# Patient Record
Sex: Male | Born: 1965 | Race: White | Hispanic: No | Marital: Married | Smoking: Former smoker
Health system: Southern US, Community
[De-identification: ages and names within clinical notes are randomized; demographics above are authoritative.]

## PROBLEM LIST (undated history)

## (undated) DIAGNOSIS — I10 Essential (primary) hypertension: Secondary | ICD-10-CM

## (undated) HISTORY — PX: HERNIA REPAIR: SHX51

## (undated) HISTORY — PX: COLONOSCOPY: SHX174

## (undated) HISTORY — PX: KNEE SURGERY: SHX244

---

## 2007-04-17 ENCOUNTER — Emergency Department (HOSPITAL_COMMUNITY): Admission: EM | Admit: 2007-04-17 | Discharge: 2007-04-17 | Payer: Self-pay | Admitting: Emergency Medicine

## 2010-10-25 LAB — URINALYSIS, ROUTINE W REFLEX MICROSCOPIC
Bilirubin Urine: NEGATIVE
Ketones, ur: NEGATIVE
Nitrite: NEGATIVE
Protein, ur: NEGATIVE
Specific Gravity, Urine: 1.024
Urobilinogen, UA: 0.2

## 2010-10-25 LAB — BASIC METABOLIC PANEL
BUN: 12
CO2: 24
Chloride: 103
Glucose, Bld: 156 — ABNORMAL HIGH
Potassium: 4.6

## 2010-10-25 LAB — URINE MICROSCOPIC-ADD ON

## 2011-05-21 ENCOUNTER — Encounter (HOSPITAL_COMMUNITY): Payer: Self-pay

## 2011-05-21 ENCOUNTER — Emergency Department (INDEPENDENT_AMBULATORY_CARE_PROVIDER_SITE_OTHER)
Admission: EM | Admit: 2011-05-21 | Discharge: 2011-05-21 | Disposition: A | Discharge status: Home Health | Payer: BC Managed Care – PPO | Source: Home / Self Care

## 2011-05-21 DIAGNOSIS — S63509A Unspecified sprain of unspecified wrist, initial encounter: Secondary | ICD-10-CM

## 2011-05-21 DIAGNOSIS — S66911A Strain of unspecified muscle, fascia and tendon at wrist and hand level, right hand, initial encounter: Secondary | ICD-10-CM

## 2011-05-21 HISTORY — DX: Essential (primary) hypertension: I10

## 2011-05-21 MED ORDER — IBUPROFEN 800 MG PO TABS: 800.0000 mg | ORAL_TABLET | Freq: Three times a day (TID) | ORAL | Status: AC

## 2011-05-21 NOTE — ED Provider Notes (Signed)
History     CSN: [id number]  Arrival date & time 05/21/11  1191   None     Chief Complaint  Patient presents with  . Wrist Pain    (Consider location/radiation/quality/duration/timing/severity/associated sxs/prior treatment) HPI Comments: Patient presents today with complaints of right wrist injury. He states yesterday while at work he had leaned into his truck, leaning onto both hands. When he pushed off to stand back up he had pain in his right wrist. No pop. He continues to have intermittent pain in the lateral wrist with radial side bending of the hand. Pain is not reproduced with any other range of motion. He did take 1 ibuprofen 800 mg tablet last night which did provide moderate relief.   Past Medical History  Diagnosis Date  . Hypertension   . Diabetes mellitus     Past Surgical History  Procedure Date  . Hernia repair   . Knee surgery     History reviewed. No pertinent family history.  History  Substance Use Topics  . Smoking status: Former Games developer  . Smokeless tobacco: Not on file  . Alcohol Use: Yes      Review of Systems  Musculoskeletal: Negative for joint swelling.  Skin: Negative for color change and wound.  Neurological: Negative for numbness.    Allergies  Review of patient's allergies indicates no known allergies.  Home Medications   Current Outpatient Rx  Name Route Sig Dispense Refill  . ASPIRIN 81 MG PO TABS Oral Take 81 mg by mouth daily.    Marland Kitchen GLIPIZIDE 10 MG PO TABS Oral Take 10 mg by mouth 2 (two) times daily before a meal.    . LISINOPRIL 10 MG PO TABS Oral Take 10 mg by mouth daily.    Marland Kitchen METFORMIN HCL 1000 MG PO TABS Oral Take 1,000 mg by mouth 2 (two) times daily with a meal.    . IBUPROFEN 800 MG PO TABS Oral Take 1 tablet (800 mg total) by mouth 3 (three) times daily. 15 tablet 0    BP 136/87  Pulse 68  Temp(Src) 98.8 F (37.1 C) (Oral)  Resp 16  SpO2 98%  Physical Exam  Nursing note and vitals reviewed. Constitutional:  He appears well-developed and well-nourished. No distress.  HENT:  Head: Normocephalic and atraumatic.  Cardiovascular: Normal rate, regular rhythm and normal heart sounds.   Pulmonary/Chest: Effort normal and breath sounds normal. No respiratory distress.  Musculoskeletal:       Right wrist: He exhibits normal range of motion, no tenderness, no bony tenderness, no swelling, no effusion, no crepitus and no deformity.       Rt wrist medial pain reproduced with passive and active radial deviation of hand. Nontender to palpation.   Skin: Skin is warm and dry.  Psychiatric: He has a normal mood and affect.    ED Course  Procedures (including critical care time)  Labs Reviewed - No data to display No results found.   1. Strain of wrist, right       MDM          Melody Comas, PA 05/21/11 [phone]

## 2011-05-21 NOTE — ED Notes (Signed)
Pt c/o R wrist pain onset yesterday while bearing upper body weight on it in vehicle.  Pt took Ibuprofen 800mg  last night with some relief.  Pain increases with lateral movement.

## 2011-05-21 NOTE — ED Notes (Signed)
Urine Drug Screen performed

## 2011-05-21 NOTE — Discharge Instructions (Signed)
Ice your wrist 3-4 times a day for 15-20 minutes. Wear wrist splint during daytime hours. You may remove it while sleeping, unless you are having night time pain, and while showering. Follow up with occupational health Monday morning.  Wrist Pain A wrist sprain happens when the bands of tissue that hold the wrist joints together (ligament) stretch too much or tear. A wrist strain happens when muscles or bands of tissue that connect muscles to bones (tendons) are stretched or pulled. HOME CARE  Put ice on the injured area.   Put ice in a plastic bag.   Place a towel between your skin and the bag.   Leave the ice on for 15 to 20 minutes, 3 to 4 times a day, for the first 2 days.   Raise (elevate) the injured wrist to lessen puffiness (swelling).   Rest the injured wrist for at least 48 hours or as told by your doctor.   Wear a splint, cast, or an elastic wrap as told by your doctor.   Only take medicine as told by your doctor.   Follow up with your doctor as told. This is important.  GET HELP RIGHT AWAY IF:   The fingers are puffy, very red, white, or cold and blue.   The fingers lose feeling (numb) or tingle.   The pain gets worse.   It is hard to move the fingers.  MAKE SURE YOU:   Understand these instructions.   Will watch your condition.   Will get help right away if you are not doing well or get worse.  Document Released: 07/06/2007 Document Revised: 01/06/2011 Document Reviewed: 03/10/2010 [facility] Patient Information 2012 [place], [place].

## 2011-05-25 NOTE — ED Provider Notes (Signed)
Medical screening examination/treatment/procedure(s) were performed by resident physician or non-physician practitioner and as supervising physician I was immediately available for consultation/collaboration.   Erie Sica DOUGLAS MD.    Smayan Hackbart D Juwuan Sedita, MD 05/25/11 1853 

## 2013-08-09 ENCOUNTER — Encounter: Payer: Self-pay | Admitting: Gastroenterology

## 2014-01-08 ENCOUNTER — Other Ambulatory Visit: Payer: Self-pay | Admitting: Orthopedic Surgery

## 2014-01-08 DIAGNOSIS — M25561 Pain in right knee: Secondary | ICD-10-CM

## 2014-01-09 ENCOUNTER — Ambulatory Visit
Admission: RE | Admit: 2014-01-09 | Discharge: 2014-01-09 | Disposition: A | Discharge status: Home / Self Care | Payer: BC Managed Care – PPO | Source: Ambulatory Visit | Attending: Orthopedic Surgery | Admitting: Orthopedic Surgery

## 2014-01-09 DIAGNOSIS — M25561 Pain in right knee: Secondary | ICD-10-CM

## 2015-05-20 DIAGNOSIS — Z79899 Other long term (current) drug therapy: Secondary | ICD-10-CM | POA: Diagnosis not present

## 2015-05-20 DIAGNOSIS — E119 Type 2 diabetes mellitus without complications: Secondary | ICD-10-CM | POA: Diagnosis not present

## 2015-05-20 DIAGNOSIS — E784 Other hyperlipidemia: Secondary | ICD-10-CM | POA: Diagnosis not present

## 2015-06-25 DIAGNOSIS — L570 Actinic keratosis: Secondary | ICD-10-CM | POA: Diagnosis not present

## 2015-06-25 DIAGNOSIS — D1801 Hemangioma of skin and subcutaneous tissue: Secondary | ICD-10-CM | POA: Diagnosis not present

## 2015-06-25 DIAGNOSIS — L814 Other melanin hyperpigmentation: Secondary | ICD-10-CM | POA: Diagnosis not present

## 2015-07-13 DIAGNOSIS — E782 Mixed hyperlipidemia: Secondary | ICD-10-CM | POA: Diagnosis not present

## 2015-07-13 DIAGNOSIS — E1165 Type 2 diabetes mellitus with hyperglycemia: Secondary | ICD-10-CM | POA: Diagnosis not present

## 2015-07-13 DIAGNOSIS — Z79899 Other long term (current) drug therapy: Secondary | ICD-10-CM | POA: Diagnosis not present

## 2015-07-27 DIAGNOSIS — E669 Obesity, unspecified: Secondary | ICD-10-CM | POA: Diagnosis not present

## 2015-07-27 DIAGNOSIS — E1165 Type 2 diabetes mellitus with hyperglycemia: Secondary | ICD-10-CM | POA: Diagnosis not present

## 2015-07-27 DIAGNOSIS — E782 Mixed hyperlipidemia: Secondary | ICD-10-CM | POA: Diagnosis not present

## 2015-07-27 DIAGNOSIS — I10 Essential (primary) hypertension: Secondary | ICD-10-CM | POA: Diagnosis not present

## 2015-08-27 DIAGNOSIS — E782 Mixed hyperlipidemia: Secondary | ICD-10-CM | POA: Diagnosis not present

## 2015-08-27 DIAGNOSIS — E1165 Type 2 diabetes mellitus with hyperglycemia: Secondary | ICD-10-CM | POA: Diagnosis not present

## 2015-08-31 DIAGNOSIS — E782 Mixed hyperlipidemia: Secondary | ICD-10-CM | POA: Diagnosis not present

## 2015-08-31 DIAGNOSIS — E1165 Type 2 diabetes mellitus with hyperglycemia: Secondary | ICD-10-CM | POA: Diagnosis not present

## 2015-08-31 DIAGNOSIS — E669 Obesity, unspecified: Secondary | ICD-10-CM | POA: Diagnosis not present

## 2015-08-31 DIAGNOSIS — I10 Essential (primary) hypertension: Secondary | ICD-10-CM | POA: Diagnosis not present

## 2015-10-15 DIAGNOSIS — E1165 Type 2 diabetes mellitus with hyperglycemia: Secondary | ICD-10-CM | POA: Diagnosis not present

## 2015-10-27 DIAGNOSIS — E669 Obesity, unspecified: Secondary | ICD-10-CM | POA: Diagnosis not present

## 2015-10-27 DIAGNOSIS — Z23 Encounter for immunization: Secondary | ICD-10-CM | POA: Diagnosis not present

## 2015-10-27 DIAGNOSIS — E1165 Type 2 diabetes mellitus with hyperglycemia: Secondary | ICD-10-CM | POA: Diagnosis not present

## 2015-10-27 DIAGNOSIS — E782 Mixed hyperlipidemia: Secondary | ICD-10-CM | POA: Diagnosis not present

## 2015-10-27 DIAGNOSIS — I10 Essential (primary) hypertension: Secondary | ICD-10-CM | POA: Diagnosis not present

## 2016-01-08 DIAGNOSIS — H524 Presbyopia: Secondary | ICD-10-CM | POA: Diagnosis not present

## 2016-01-08 DIAGNOSIS — H5203 Hypermetropia, bilateral: Secondary | ICD-10-CM | POA: Diagnosis not present

## 2016-01-08 DIAGNOSIS — E119 Type 2 diabetes mellitus without complications: Secondary | ICD-10-CM | POA: Diagnosis not present

## 2016-02-19 DIAGNOSIS — E1165 Type 2 diabetes mellitus with hyperglycemia: Secondary | ICD-10-CM | POA: Diagnosis not present

## 2016-02-22 DIAGNOSIS — E1165 Type 2 diabetes mellitus with hyperglycemia: Secondary | ICD-10-CM | POA: Diagnosis not present

## 2016-02-22 DIAGNOSIS — I10 Essential (primary) hypertension: Secondary | ICD-10-CM | POA: Diagnosis not present

## 2016-02-22 DIAGNOSIS — E559 Vitamin D deficiency, unspecified: Secondary | ICD-10-CM | POA: Diagnosis not present

## 2016-02-22 DIAGNOSIS — E782 Mixed hyperlipidemia: Secondary | ICD-10-CM | POA: Diagnosis not present

## 2016-05-18 DIAGNOSIS — E1165 Type 2 diabetes mellitus with hyperglycemia: Secondary | ICD-10-CM | POA: Diagnosis not present

## 2016-05-18 DIAGNOSIS — E782 Mixed hyperlipidemia: Secondary | ICD-10-CM | POA: Diagnosis not present

## 2016-05-23 DIAGNOSIS — I10 Essential (primary) hypertension: Secondary | ICD-10-CM | POA: Diagnosis not present

## 2016-05-23 DIAGNOSIS — E782 Mixed hyperlipidemia: Secondary | ICD-10-CM | POA: Diagnosis not present

## 2016-05-23 DIAGNOSIS — E1165 Type 2 diabetes mellitus with hyperglycemia: Secondary | ICD-10-CM | POA: Diagnosis not present

## 2016-05-23 DIAGNOSIS — E559 Vitamin D deficiency, unspecified: Secondary | ICD-10-CM | POA: Diagnosis not present

## 2016-08-16 DIAGNOSIS — E1165 Type 2 diabetes mellitus with hyperglycemia: Secondary | ICD-10-CM | POA: Diagnosis not present

## 2016-08-16 DIAGNOSIS — E782 Mixed hyperlipidemia: Secondary | ICD-10-CM | POA: Diagnosis not present

## 2016-08-16 DIAGNOSIS — E559 Vitamin D deficiency, unspecified: Secondary | ICD-10-CM | POA: Diagnosis not present

## 2016-08-16 DIAGNOSIS — E538 Deficiency of other specified B group vitamins: Secondary | ICD-10-CM | POA: Diagnosis not present

## 2016-08-22 DIAGNOSIS — I10 Essential (primary) hypertension: Secondary | ICD-10-CM | POA: Diagnosis not present

## 2016-08-22 DIAGNOSIS — E782 Mixed hyperlipidemia: Secondary | ICD-10-CM | POA: Diagnosis not present

## 2016-08-22 DIAGNOSIS — E1165 Type 2 diabetes mellitus with hyperglycemia: Secondary | ICD-10-CM | POA: Diagnosis not present

## 2016-08-22 DIAGNOSIS — E559 Vitamin D deficiency, unspecified: Secondary | ICD-10-CM | POA: Diagnosis not present

## 2016-10-03 IMAGING — MR MR KNEE*R* W/O CM
4 of 6 series · 18 of 40 positions shown · non-contrast
Comparison: None.

CLINICAL DATA: Anterolateral knee pain for 9 months. Remote
football injury and arthroscopic surgery. No recent injury. Initial
encounter.

EXAM:
MRI OF THE RIGHT KNEE WITHOUT CONTRAST
TECHNIQUE: Multiplanar, multisequence MR imaging of the knee was performed. No
intravenous contrast was administered.

[Series 3: PD fat-sat · sagittal · 3.0mm · 0.31mm/px · 8 of 29 slices shown (1 of 4)]
[im 1/29]
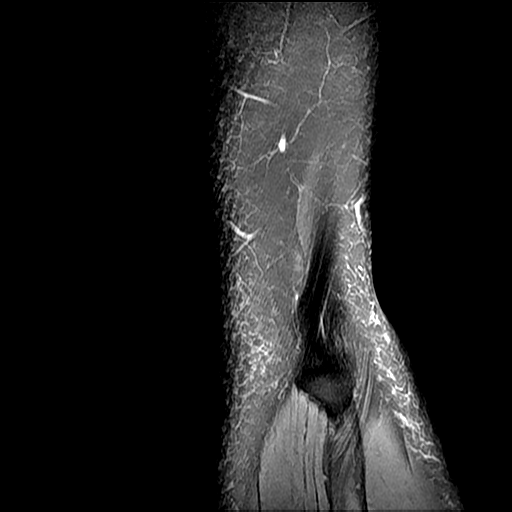
[im 5/29]
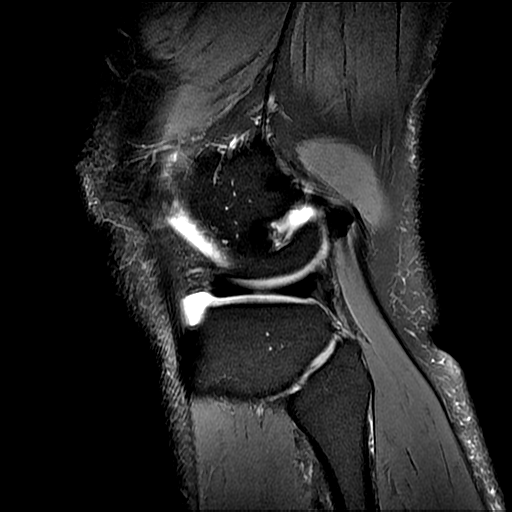
[im 9/29]
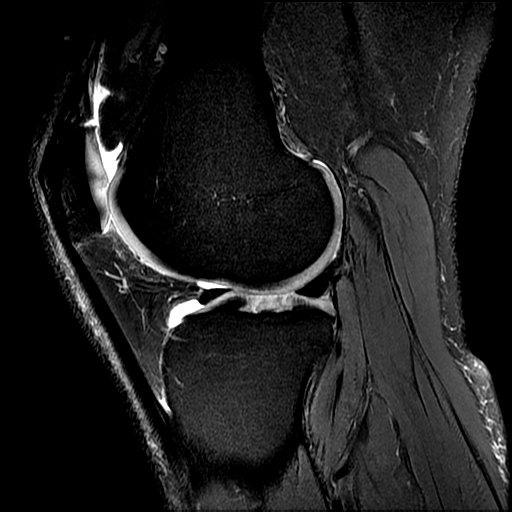
[im 13/29]
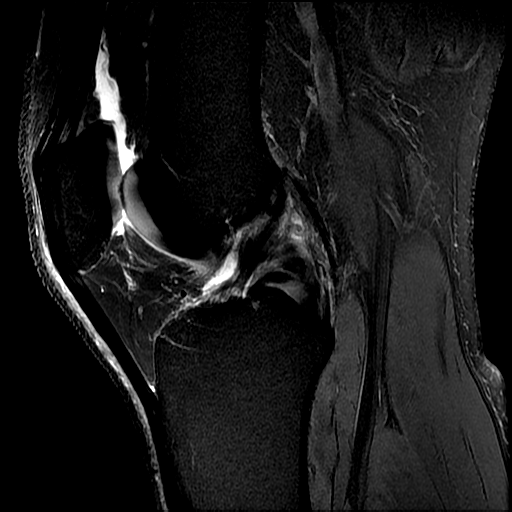
[im 17/29]
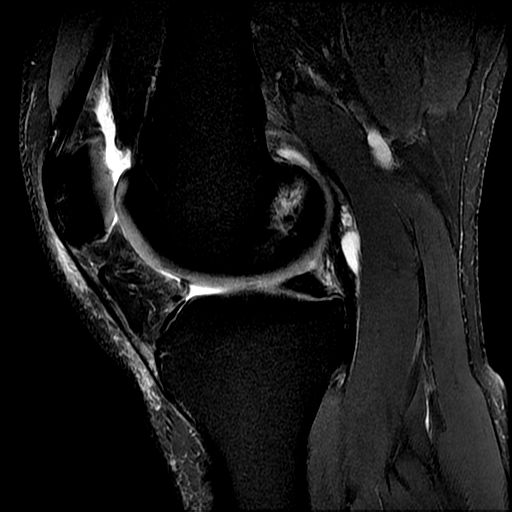
[im 21/29]
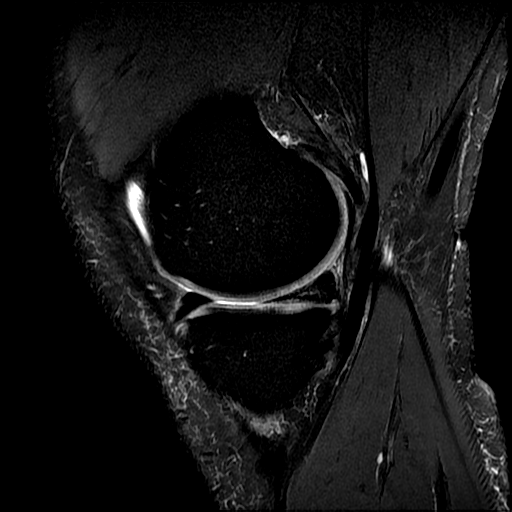
[im 25/29]
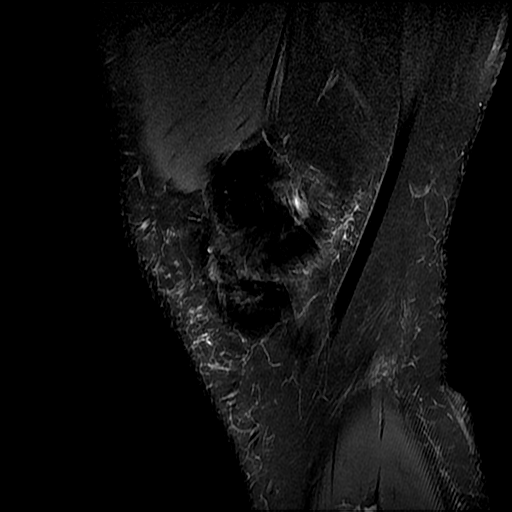
[im 29/29]
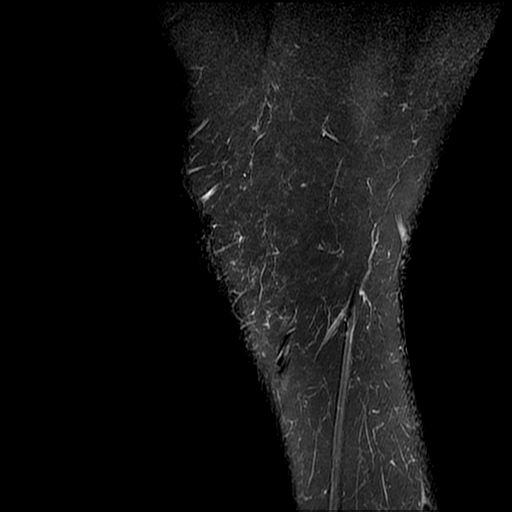

[Series 4: PD fat-sat · axial · 4.0mm · 0.31mm/px · z∈[-32,+63]mm · 4 of 24 slices shown (2 of 4)]
[im 1/24]
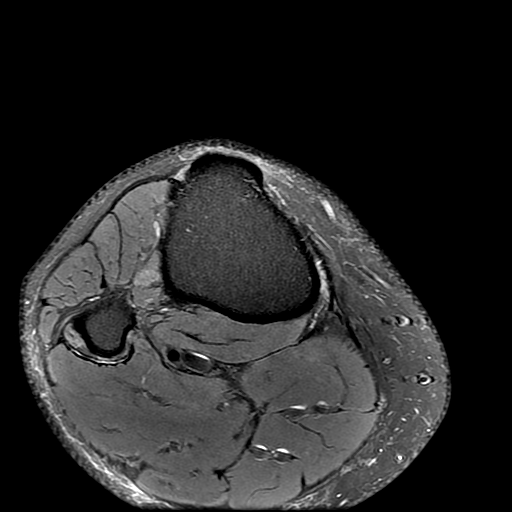
[im 4/24]
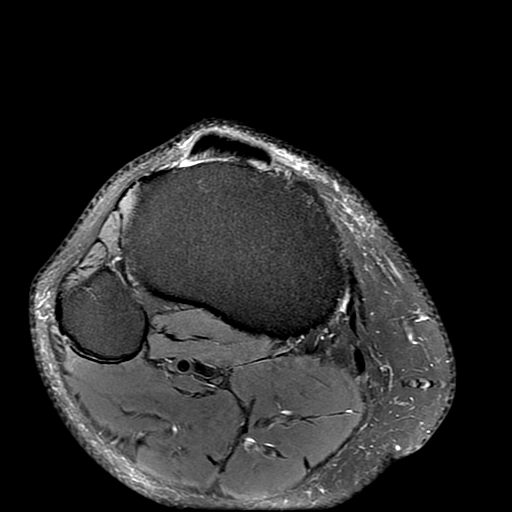
[im 12/24]
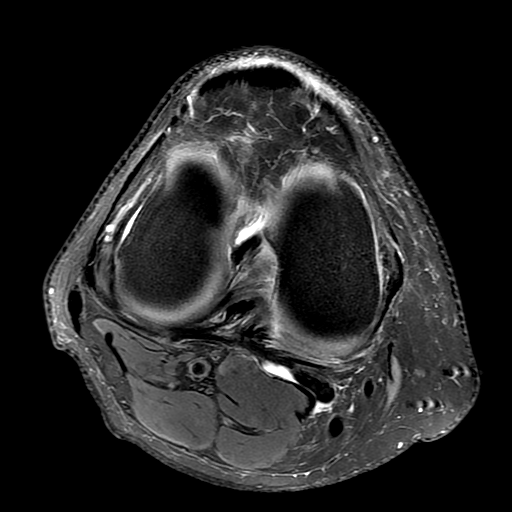
[im 20/24]
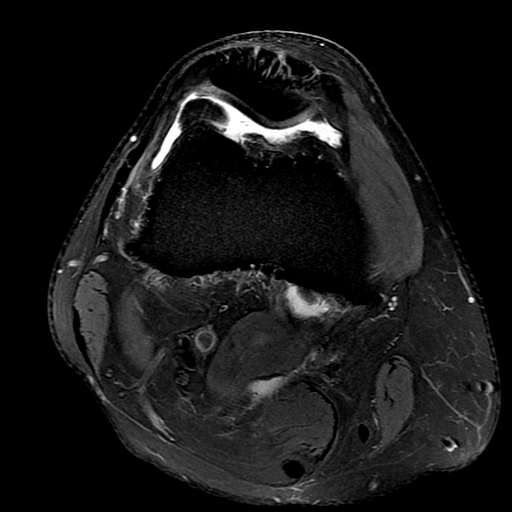

[Series 7: PD fat-sat · coronal · 4.0mm · 0.31mm/px · 3 of 25 slices shown (3 of 4)]
[im 5/25]
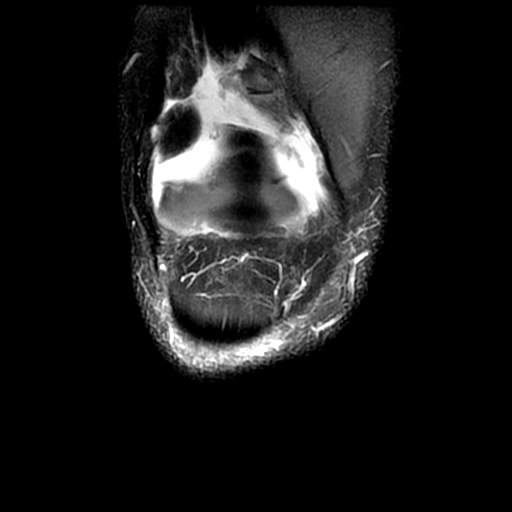
[im 13/25]
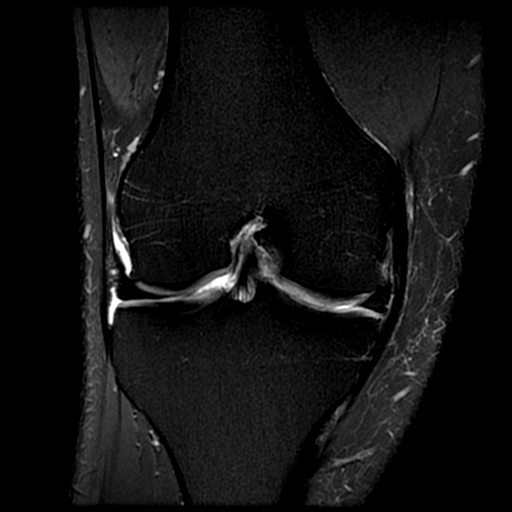
[im 21/25]
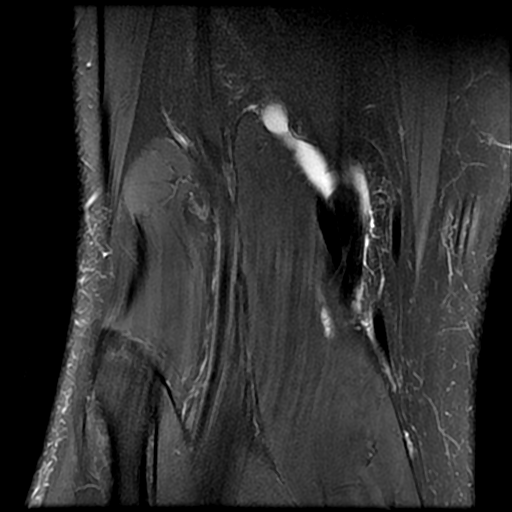

[Series 8: PD fat-sat · coronal · 2.0mm · 0.31mm/px · 3 of 13 slices shown (4 of 4)]
[im 1/13]
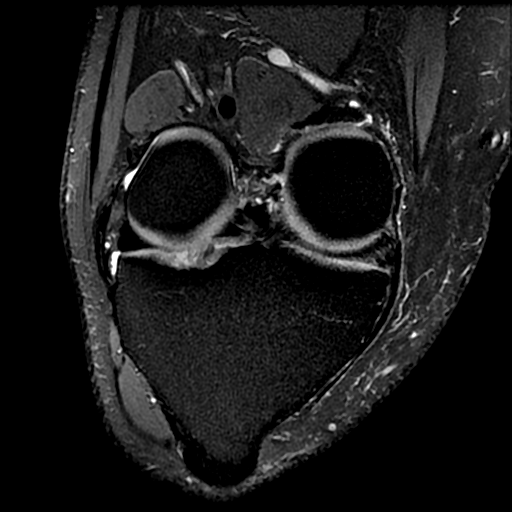
[im 9/13]
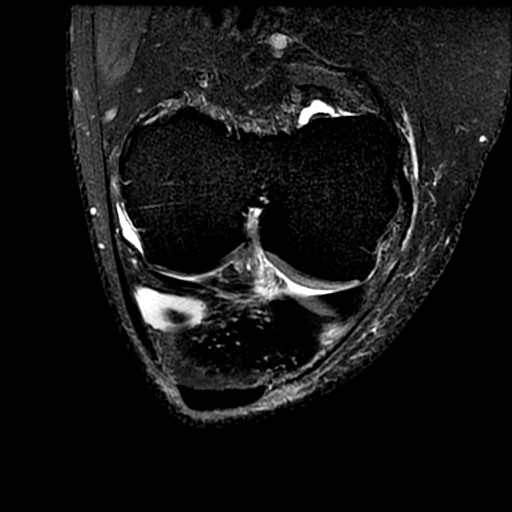
[im 13/13]
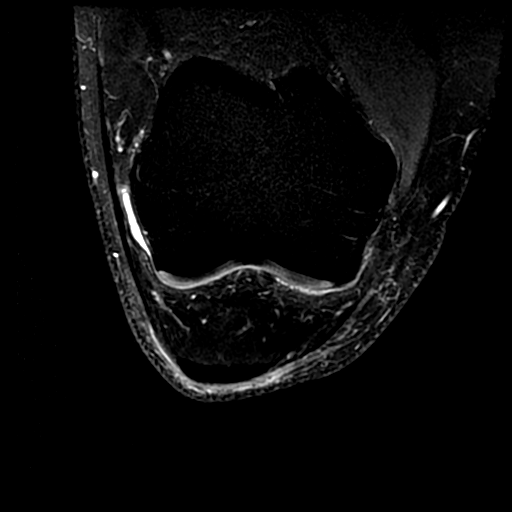

[18 of 40 positions shown; findings below may reference images not displayed]

FINDINGS: MENISCI

Medial meniscus: There is diffuse intrasubstance signal throughout
the posterior horn and body. This demonstrates no linear component,
definite extension to an articular surface or associated contour
irregularity to suggest a tear. There is no displaced meniscal
fragment.

Lateral meniscus:  Intact with normal morphology.

LIGAMENTS

Cruciates:  Intact.

Collaterals: The medial collateral ligament is thickened, likely
related to remote injury. The lateral collateral ligament complex
appears normal.

CARTILAGE

Patellofemoral:  Well preserved.

Medial: Minimal chondral thinning and surface irregularity. No
full-thickness defect.

Lateral: There is an approximately 1.5 cm area of chondral fissuring
and surface irregularity involving the central portion of the
lateral tibial plateau. There is no subchondral signal abnormality
or displaced chondral fragment. The femoral cartilage appears
intact.

Joint: Minimal joint fluid. No intra-articular loose bodies
demonstrated.

Popliteal Fossa:  Small Baker's cyst.

Extensor Mechanism:  Intact.

Bones:  No significant extra-articular osseous findings.
IMPRESSION: 1. No acute findings or evidence of internal derangement.
2. Degenerative signal throughout the posterior horn and body of the
medial meniscus. No meniscal tear or displaced fragment identified.
3. Central lateral tibial chondromalacia without subchondral signal
abnormality or displaced chondral fragment.
4. The lateral meniscus, cruciate and collateral ligaments are
intact. Thickened MCL suggests remote injury.
5. Small Baker's cyst.

## 2016-11-16 DIAGNOSIS — E1165 Type 2 diabetes mellitus with hyperglycemia: Secondary | ICD-10-CM | POA: Diagnosis not present

## 2016-11-16 DIAGNOSIS — E782 Mixed hyperlipidemia: Secondary | ICD-10-CM | POA: Diagnosis not present

## 2016-11-22 DIAGNOSIS — I10 Essential (primary) hypertension: Secondary | ICD-10-CM | POA: Diagnosis not present

## 2016-11-22 DIAGNOSIS — E559 Vitamin D deficiency, unspecified: Secondary | ICD-10-CM | POA: Diagnosis not present

## 2016-11-22 DIAGNOSIS — E1165 Type 2 diabetes mellitus with hyperglycemia: Secondary | ICD-10-CM | POA: Diagnosis not present

## 2016-11-22 DIAGNOSIS — Z23 Encounter for immunization: Secondary | ICD-10-CM | POA: Diagnosis not present

## 2016-11-22 DIAGNOSIS — E782 Mixed hyperlipidemia: Secondary | ICD-10-CM | POA: Diagnosis not present

## 2016-12-06 DIAGNOSIS — Z23 Encounter for immunization: Secondary | ICD-10-CM | POA: Diagnosis not present

## 2016-12-06 DIAGNOSIS — Z0001 Encounter for general adult medical examination with abnormal findings: Secondary | ICD-10-CM | POA: Diagnosis not present

## 2016-12-06 DIAGNOSIS — K649 Unspecified hemorrhoids: Secondary | ICD-10-CM | POA: Diagnosis not present

## 2016-12-06 DIAGNOSIS — Z125 Encounter for screening for malignant neoplasm of prostate: Secondary | ICD-10-CM | POA: Diagnosis not present

## 2016-12-06 DIAGNOSIS — Z1159 Encounter for screening for other viral diseases: Secondary | ICD-10-CM | POA: Diagnosis not present

## 2016-12-12 DIAGNOSIS — M7501 Adhesive capsulitis of right shoulder: Secondary | ICD-10-CM | POA: Diagnosis not present

## 2016-12-13 DIAGNOSIS — M25611 Stiffness of right shoulder, not elsewhere classified: Secondary | ICD-10-CM | POA: Diagnosis not present

## 2016-12-13 DIAGNOSIS — M25511 Pain in right shoulder: Secondary | ICD-10-CM | POA: Diagnosis not present

## 2016-12-13 DIAGNOSIS — M7501 Adhesive capsulitis of right shoulder: Secondary | ICD-10-CM | POA: Diagnosis not present

## 2016-12-15 DIAGNOSIS — M25511 Pain in right shoulder: Secondary | ICD-10-CM | POA: Diagnosis not present

## 2016-12-15 DIAGNOSIS — M7501 Adhesive capsulitis of right shoulder: Secondary | ICD-10-CM | POA: Diagnosis not present

## 2016-12-15 DIAGNOSIS — M25611 Stiffness of right shoulder, not elsewhere classified: Secondary | ICD-10-CM | POA: Diagnosis not present

## 2016-12-20 DIAGNOSIS — M7501 Adhesive capsulitis of right shoulder: Secondary | ICD-10-CM | POA: Diagnosis not present

## 2016-12-20 DIAGNOSIS — M25611 Stiffness of right shoulder, not elsewhere classified: Secondary | ICD-10-CM | POA: Diagnosis not present

## 2016-12-20 DIAGNOSIS — M25511 Pain in right shoulder: Secondary | ICD-10-CM | POA: Diagnosis not present

## 2016-12-27 DIAGNOSIS — M25511 Pain in right shoulder: Secondary | ICD-10-CM | POA: Diagnosis not present

## 2016-12-27 DIAGNOSIS — M25611 Stiffness of right shoulder, not elsewhere classified: Secondary | ICD-10-CM | POA: Diagnosis not present

## 2016-12-27 DIAGNOSIS — M7501 Adhesive capsulitis of right shoulder: Secondary | ICD-10-CM | POA: Diagnosis not present

## 2017-01-03 DIAGNOSIS — M7501 Adhesive capsulitis of right shoulder: Secondary | ICD-10-CM | POA: Diagnosis not present

## 2017-01-03 DIAGNOSIS — M25511 Pain in right shoulder: Secondary | ICD-10-CM | POA: Diagnosis not present

## 2017-01-03 DIAGNOSIS — M25611 Stiffness of right shoulder, not elsewhere classified: Secondary | ICD-10-CM | POA: Diagnosis not present

## 2017-01-13 DIAGNOSIS — M7501 Adhesive capsulitis of right shoulder: Secondary | ICD-10-CM | POA: Diagnosis not present

## 2017-01-13 DIAGNOSIS — M25611 Stiffness of right shoulder, not elsewhere classified: Secondary | ICD-10-CM | POA: Diagnosis not present

## 2017-01-13 DIAGNOSIS — M25511 Pain in right shoulder: Secondary | ICD-10-CM | POA: Diagnosis not present

## 2017-01-17 DIAGNOSIS — M7501 Adhesive capsulitis of right shoulder: Secondary | ICD-10-CM | POA: Diagnosis not present

## 2017-01-17 DIAGNOSIS — M25611 Stiffness of right shoulder, not elsewhere classified: Secondary | ICD-10-CM | POA: Diagnosis not present

## 2017-01-17 DIAGNOSIS — M25511 Pain in right shoulder: Secondary | ICD-10-CM | POA: Diagnosis not present

## 2017-01-19 DIAGNOSIS — M25611 Stiffness of right shoulder, not elsewhere classified: Secondary | ICD-10-CM | POA: Diagnosis not present

## 2017-01-19 DIAGNOSIS — M7501 Adhesive capsulitis of right shoulder: Secondary | ICD-10-CM | POA: Diagnosis not present

## 2017-01-19 DIAGNOSIS — M25511 Pain in right shoulder: Secondary | ICD-10-CM | POA: Diagnosis not present

## 2017-01-23 DIAGNOSIS — M25511 Pain in right shoulder: Secondary | ICD-10-CM | POA: Diagnosis not present

## 2017-01-23 DIAGNOSIS — M7501 Adhesive capsulitis of right shoulder: Secondary | ICD-10-CM | POA: Diagnosis not present

## 2017-01-23 DIAGNOSIS — M25611 Stiffness of right shoulder, not elsewhere classified: Secondary | ICD-10-CM | POA: Diagnosis not present

## 2017-01-25 DIAGNOSIS — M7501 Adhesive capsulitis of right shoulder: Secondary | ICD-10-CM | POA: Diagnosis not present

## 2017-01-27 DIAGNOSIS — M25511 Pain in right shoulder: Secondary | ICD-10-CM | POA: Diagnosis not present

## 2017-01-27 DIAGNOSIS — M7501 Adhesive capsulitis of right shoulder: Secondary | ICD-10-CM | POA: Diagnosis not present

## 2017-01-27 DIAGNOSIS — M25611 Stiffness of right shoulder, not elsewhere classified: Secondary | ICD-10-CM | POA: Diagnosis not present

## 2017-02-02 DIAGNOSIS — M25611 Stiffness of right shoulder, not elsewhere classified: Secondary | ICD-10-CM | POA: Diagnosis not present

## 2017-02-02 DIAGNOSIS — M7501 Adhesive capsulitis of right shoulder: Secondary | ICD-10-CM | POA: Diagnosis not present

## 2017-02-02 DIAGNOSIS — M25511 Pain in right shoulder: Secondary | ICD-10-CM | POA: Diagnosis not present

## 2017-02-15 DIAGNOSIS — H52203 Unspecified astigmatism, bilateral: Secondary | ICD-10-CM | POA: Diagnosis not present

## 2017-02-15 DIAGNOSIS — H524 Presbyopia: Secondary | ICD-10-CM | POA: Diagnosis not present

## 2017-02-15 DIAGNOSIS — H5203 Hypermetropia, bilateral: Secondary | ICD-10-CM | POA: Diagnosis not present

## 2017-02-15 DIAGNOSIS — E119 Type 2 diabetes mellitus without complications: Secondary | ICD-10-CM | POA: Diagnosis not present

## 2017-02-17 DIAGNOSIS — E782 Mixed hyperlipidemia: Secondary | ICD-10-CM | POA: Diagnosis not present

## 2017-02-17 DIAGNOSIS — E1165 Type 2 diabetes mellitus with hyperglycemia: Secondary | ICD-10-CM | POA: Diagnosis not present

## 2017-02-23 DIAGNOSIS — I10 Essential (primary) hypertension: Secondary | ICD-10-CM | POA: Diagnosis not present

## 2017-02-23 DIAGNOSIS — E1165 Type 2 diabetes mellitus with hyperglycemia: Secondary | ICD-10-CM | POA: Diagnosis not present

## 2017-02-23 DIAGNOSIS — E782 Mixed hyperlipidemia: Secondary | ICD-10-CM | POA: Diagnosis not present

## 2017-02-23 DIAGNOSIS — E559 Vitamin D deficiency, unspecified: Secondary | ICD-10-CM | POA: Diagnosis not present

## 2017-05-17 DIAGNOSIS — E782 Mixed hyperlipidemia: Secondary | ICD-10-CM | POA: Diagnosis not present

## 2017-05-17 DIAGNOSIS — E1165 Type 2 diabetes mellitus with hyperglycemia: Secondary | ICD-10-CM | POA: Diagnosis not present

## 2017-05-23 DIAGNOSIS — I10 Essential (primary) hypertension: Secondary | ICD-10-CM | POA: Diagnosis not present

## 2017-05-23 DIAGNOSIS — E559 Vitamin D deficiency, unspecified: Secondary | ICD-10-CM | POA: Diagnosis not present

## 2017-05-23 DIAGNOSIS — E1165 Type 2 diabetes mellitus with hyperglycemia: Secondary | ICD-10-CM | POA: Diagnosis not present

## 2017-05-23 DIAGNOSIS — E782 Mixed hyperlipidemia: Secondary | ICD-10-CM | POA: Diagnosis not present

## 2017-07-07 DIAGNOSIS — M792 Neuralgia and neuritis, unspecified: Secondary | ICD-10-CM | POA: Diagnosis not present

## 2017-07-12 DIAGNOSIS — G5600 Carpal tunnel syndrome, unspecified upper limb: Secondary | ICD-10-CM | POA: Diagnosis not present

## 2017-07-27 DIAGNOSIS — D225 Melanocytic nevi of trunk: Secondary | ICD-10-CM | POA: Diagnosis not present

## 2017-07-27 DIAGNOSIS — D1801 Hemangioma of skin and subcutaneous tissue: Secondary | ICD-10-CM | POA: Diagnosis not present

## 2017-07-27 DIAGNOSIS — D2262 Melanocytic nevi of left upper limb, including shoulder: Secondary | ICD-10-CM | POA: Diagnosis not present

## 2017-07-27 DIAGNOSIS — L814 Other melanin hyperpigmentation: Secondary | ICD-10-CM | POA: Diagnosis not present

## 2017-08-21 DIAGNOSIS — E1165 Type 2 diabetes mellitus with hyperglycemia: Secondary | ICD-10-CM | POA: Diagnosis not present

## 2017-08-21 DIAGNOSIS — E559 Vitamin D deficiency, unspecified: Secondary | ICD-10-CM | POA: Diagnosis not present

## 2017-08-21 DIAGNOSIS — E538 Deficiency of other specified B group vitamins: Secondary | ICD-10-CM | POA: Diagnosis not present

## 2017-08-21 DIAGNOSIS — E782 Mixed hyperlipidemia: Secondary | ICD-10-CM | POA: Diagnosis not present

## 2017-08-25 DIAGNOSIS — E1165 Type 2 diabetes mellitus with hyperglycemia: Secondary | ICD-10-CM | POA: Diagnosis not present

## 2017-08-25 DIAGNOSIS — I10 Essential (primary) hypertension: Secondary | ICD-10-CM | POA: Diagnosis not present

## 2017-08-25 DIAGNOSIS — E559 Vitamin D deficiency, unspecified: Secondary | ICD-10-CM | POA: Diagnosis not present

## 2017-08-25 DIAGNOSIS — E782 Mixed hyperlipidemia: Secondary | ICD-10-CM | POA: Diagnosis not present

## 2017-11-23 DIAGNOSIS — E1165 Type 2 diabetes mellitus with hyperglycemia: Secondary | ICD-10-CM | POA: Diagnosis not present

## 2017-11-23 DIAGNOSIS — E782 Mixed hyperlipidemia: Secondary | ICD-10-CM | POA: Diagnosis not present

## 2017-12-01 DIAGNOSIS — E1165 Type 2 diabetes mellitus with hyperglycemia: Secondary | ICD-10-CM | POA: Diagnosis not present

## 2017-12-01 DIAGNOSIS — E782 Mixed hyperlipidemia: Secondary | ICD-10-CM | POA: Diagnosis not present

## 2017-12-01 DIAGNOSIS — E559 Vitamin D deficiency, unspecified: Secondary | ICD-10-CM | POA: Diagnosis not present

## 2017-12-01 DIAGNOSIS — Z23 Encounter for immunization: Secondary | ICD-10-CM | POA: Diagnosis not present

## 2017-12-01 DIAGNOSIS — I10 Essential (primary) hypertension: Secondary | ICD-10-CM | POA: Diagnosis not present

## 2017-12-07 DIAGNOSIS — Z0001 Encounter for general adult medical examination with abnormal findings: Secondary | ICD-10-CM | POA: Diagnosis not present

## 2017-12-07 DIAGNOSIS — Z125 Encounter for screening for malignant neoplasm of prostate: Secondary | ICD-10-CM | POA: Diagnosis not present

## 2018-02-15 DIAGNOSIS — H5203 Hypermetropia, bilateral: Secondary | ICD-10-CM | POA: Diagnosis not present

## 2018-02-15 DIAGNOSIS — H2513 Age-related nuclear cataract, bilateral: Secondary | ICD-10-CM | POA: Diagnosis not present

## 2018-02-15 DIAGNOSIS — E119 Type 2 diabetes mellitus without complications: Secondary | ICD-10-CM | POA: Diagnosis not present

## 2018-02-15 DIAGNOSIS — H52203 Unspecified astigmatism, bilateral: Secondary | ICD-10-CM | POA: Diagnosis not present

## 2018-03-02 DIAGNOSIS — E1165 Type 2 diabetes mellitus with hyperglycemia: Secondary | ICD-10-CM | POA: Diagnosis not present

## 2018-03-02 DIAGNOSIS — E782 Mixed hyperlipidemia: Secondary | ICD-10-CM | POA: Diagnosis not present

## 2018-03-09 DIAGNOSIS — I10 Essential (primary) hypertension: Secondary | ICD-10-CM | POA: Diagnosis not present

## 2018-03-09 DIAGNOSIS — E559 Vitamin D deficiency, unspecified: Secondary | ICD-10-CM | POA: Diagnosis not present

## 2018-03-09 DIAGNOSIS — E782 Mixed hyperlipidemia: Secondary | ICD-10-CM | POA: Diagnosis not present

## 2018-03-09 DIAGNOSIS — E1165 Type 2 diabetes mellitus with hyperglycemia: Secondary | ICD-10-CM | POA: Diagnosis not present

## 2018-04-03 DIAGNOSIS — E1165 Type 2 diabetes mellitus with hyperglycemia: Secondary | ICD-10-CM | POA: Diagnosis not present

## 2018-04-03 DIAGNOSIS — Z7984 Long term (current) use of oral hypoglycemic drugs: Secondary | ICD-10-CM | POA: Diagnosis not present

## 2018-05-08 DIAGNOSIS — E1165 Type 2 diabetes mellitus with hyperglycemia: Secondary | ICD-10-CM | POA: Diagnosis not present

## 2018-05-30 DIAGNOSIS — E782 Mixed hyperlipidemia: Secondary | ICD-10-CM | POA: Diagnosis not present

## 2018-05-30 DIAGNOSIS — E1165 Type 2 diabetes mellitus with hyperglycemia: Secondary | ICD-10-CM | POA: Diagnosis not present

## 2018-06-08 DIAGNOSIS — E1165 Type 2 diabetes mellitus with hyperglycemia: Secondary | ICD-10-CM | POA: Diagnosis not present

## 2018-06-08 DIAGNOSIS — E782 Mixed hyperlipidemia: Secondary | ICD-10-CM | POA: Diagnosis not present

## 2018-06-08 DIAGNOSIS — I10 Essential (primary) hypertension: Secondary | ICD-10-CM | POA: Diagnosis not present

## 2018-06-08 DIAGNOSIS — E559 Vitamin D deficiency, unspecified: Secondary | ICD-10-CM | POA: Diagnosis not present

## 2018-09-03 DIAGNOSIS — L814 Other melanin hyperpigmentation: Secondary | ICD-10-CM | POA: Diagnosis not present

## 2018-09-03 DIAGNOSIS — E782 Mixed hyperlipidemia: Secondary | ICD-10-CM | POA: Diagnosis not present

## 2018-09-03 DIAGNOSIS — L570 Actinic keratosis: Secondary | ICD-10-CM | POA: Diagnosis not present

## 2018-09-03 DIAGNOSIS — D2262 Melanocytic nevi of left upper limb, including shoulder: Secondary | ICD-10-CM | POA: Diagnosis not present

## 2018-09-03 DIAGNOSIS — E1165 Type 2 diabetes mellitus with hyperglycemia: Secondary | ICD-10-CM | POA: Diagnosis not present

## 2018-09-03 DIAGNOSIS — D225 Melanocytic nevi of trunk: Secondary | ICD-10-CM | POA: Diagnosis not present

## 2018-09-14 DIAGNOSIS — E1165 Type 2 diabetes mellitus with hyperglycemia: Secondary | ICD-10-CM | POA: Diagnosis not present

## 2018-09-14 DIAGNOSIS — E559 Vitamin D deficiency, unspecified: Secondary | ICD-10-CM | POA: Diagnosis not present

## 2018-09-14 DIAGNOSIS — I10 Essential (primary) hypertension: Secondary | ICD-10-CM | POA: Diagnosis not present

## 2018-09-14 DIAGNOSIS — E782 Mixed hyperlipidemia: Secondary | ICD-10-CM | POA: Diagnosis not present

## 2018-09-20 ENCOUNTER — Encounter: Payer: Self-pay | Admitting: Gastroenterology

## 2018-12-11 DIAGNOSIS — Z0001 Encounter for general adult medical examination with abnormal findings: Secondary | ICD-10-CM | POA: Diagnosis not present

## 2018-12-12 DIAGNOSIS — Z125 Encounter for screening for malignant neoplasm of prostate: Secondary | ICD-10-CM | POA: Diagnosis not present

## 2018-12-12 DIAGNOSIS — Z23 Encounter for immunization: Secondary | ICD-10-CM | POA: Diagnosis not present

## 2018-12-18 DIAGNOSIS — E782 Mixed hyperlipidemia: Secondary | ICD-10-CM | POA: Diagnosis not present

## 2018-12-18 DIAGNOSIS — E1165 Type 2 diabetes mellitus with hyperglycemia: Secondary | ICD-10-CM | POA: Diagnosis not present

## 2018-12-18 DIAGNOSIS — E538 Deficiency of other specified B group vitamins: Secondary | ICD-10-CM | POA: Diagnosis not present

## 2018-12-18 DIAGNOSIS — E559 Vitamin D deficiency, unspecified: Secondary | ICD-10-CM | POA: Diagnosis not present

## 2018-12-20 DIAGNOSIS — E1165 Type 2 diabetes mellitus with hyperglycemia: Secondary | ICD-10-CM | POA: Diagnosis not present

## 2018-12-20 DIAGNOSIS — E782 Mixed hyperlipidemia: Secondary | ICD-10-CM | POA: Diagnosis not present

## 2018-12-20 DIAGNOSIS — E559 Vitamin D deficiency, unspecified: Secondary | ICD-10-CM | POA: Diagnosis not present

## 2018-12-20 DIAGNOSIS — I10 Essential (primary) hypertension: Secondary | ICD-10-CM | POA: Diagnosis not present

## 2019-02-20 DIAGNOSIS — H25813 Combined forms of age-related cataract, bilateral: Secondary | ICD-10-CM | POA: Diagnosis not present

## 2019-02-20 DIAGNOSIS — E119 Type 2 diabetes mellitus without complications: Secondary | ICD-10-CM | POA: Diagnosis not present

## 2019-03-20 DIAGNOSIS — E782 Mixed hyperlipidemia: Secondary | ICD-10-CM | POA: Diagnosis not present

## 2019-03-20 DIAGNOSIS — E1165 Type 2 diabetes mellitus with hyperglycemia: Secondary | ICD-10-CM | POA: Diagnosis not present

## 2019-04-05 DIAGNOSIS — E1165 Type 2 diabetes mellitus with hyperglycemia: Secondary | ICD-10-CM | POA: Diagnosis not present

## 2019-04-05 DIAGNOSIS — E559 Vitamin D deficiency, unspecified: Secondary | ICD-10-CM | POA: Diagnosis not present

## 2019-04-05 DIAGNOSIS — I10 Essential (primary) hypertension: Secondary | ICD-10-CM | POA: Diagnosis not present

## 2019-04-05 DIAGNOSIS — E782 Mixed hyperlipidemia: Secondary | ICD-10-CM | POA: Diagnosis not present

## 2019-09-16 DIAGNOSIS — D2262 Melanocytic nevi of left upper limb, including shoulder: Secondary | ICD-10-CM | POA: Diagnosis not present

## 2019-09-16 DIAGNOSIS — D225 Melanocytic nevi of trunk: Secondary | ICD-10-CM | POA: Diagnosis not present

## 2019-09-16 DIAGNOSIS — L814 Other melanin hyperpigmentation: Secondary | ICD-10-CM | POA: Diagnosis not present

## 2019-09-16 DIAGNOSIS — L821 Other seborrheic keratosis: Secondary | ICD-10-CM | POA: Diagnosis not present

## 2019-09-16 DIAGNOSIS — L570 Actinic keratosis: Secondary | ICD-10-CM | POA: Diagnosis not present

## 2019-12-10 DIAGNOSIS — E1165 Type 2 diabetes mellitus with hyperglycemia: Secondary | ICD-10-CM | POA: Diagnosis not present

## 2019-12-10 DIAGNOSIS — I10 Essential (primary) hypertension: Secondary | ICD-10-CM | POA: Diagnosis not present

## 2019-12-10 DIAGNOSIS — E782 Mixed hyperlipidemia: Secondary | ICD-10-CM | POA: Diagnosis not present

## 2019-12-12 DIAGNOSIS — Z23 Encounter for immunization: Secondary | ICD-10-CM | POA: Diagnosis not present

## 2019-12-12 DIAGNOSIS — Z125 Encounter for screening for malignant neoplasm of prostate: Secondary | ICD-10-CM | POA: Diagnosis not present

## 2019-12-12 DIAGNOSIS — Z0001 Encounter for general adult medical examination with abnormal findings: Secondary | ICD-10-CM | POA: Diagnosis not present

## 2019-12-20 ENCOUNTER — Ambulatory Visit: Payer: Self-pay

## 2019-12-20 ENCOUNTER — Ambulatory Visit
Admission: EM | Admit: 2019-12-20 | Discharge: 2019-12-20 | Disposition: A | Discharge status: Home / Self Care | Payer: BC Managed Care – PPO | Attending: Physician Assistant | Admitting: Physician Assistant

## 2019-12-20 ENCOUNTER — Ambulatory Visit (INDEPENDENT_AMBULATORY_CARE_PROVIDER_SITE_OTHER): Payer: BC Managed Care – PPO

## 2019-12-20 DIAGNOSIS — M79642 Pain in left hand: Secondary | ICD-10-CM

## 2019-12-20 DIAGNOSIS — S6992XA Unspecified injury of left wrist, hand and finger(s), initial encounter: Secondary | ICD-10-CM | POA: Diagnosis not present

## 2019-12-20 DIAGNOSIS — I10 Essential (primary) hypertension: Secondary | ICD-10-CM | POA: Diagnosis not present

## 2019-12-20 DIAGNOSIS — E669 Obesity, unspecified: Secondary | ICD-10-CM | POA: Diagnosis not present

## 2019-12-20 DIAGNOSIS — E782 Mixed hyperlipidemia: Secondary | ICD-10-CM | POA: Diagnosis not present

## 2019-12-20 DIAGNOSIS — M7989 Other specified soft tissue disorders: Secondary | ICD-10-CM | POA: Diagnosis not present

## 2019-12-20 DIAGNOSIS — M795 Residual foreign body in soft tissue: Secondary | ICD-10-CM | POA: Diagnosis not present

## 2019-12-20 DIAGNOSIS — E119 Type 2 diabetes mellitus without complications: Secondary | ICD-10-CM | POA: Diagnosis not present

## 2019-12-20 MED ORDER — MELOXICAM 7.5 MG PO TABS: 7.5000 mg | ORAL_TABLET | Freq: Every day | ORAL | 0 refills | Status: DC

## 2019-12-20 NOTE — Discharge Instructions (Signed)
As discussed, a possible chipped bone over the joint. For now, keep finger splint on. Start Mobic. Do not take ibuprofen (motrin/advil)/ naproxen (aleve) while on mobic. Ice compress. Follow up with hand orthopedics if symptoms not improving. We will give you a call if radiology read changes the plan.

## 2019-12-20 NOTE — ED Triage Notes (Signed)
Pt states hit his lt hand on something on Saturday, unsure when or what. States pain is getting worse.

## 2019-12-20 NOTE — ED Provider Notes (Signed)
[facility]    CSN: [id number] Arrival date & time: 12/20/19  1542      History   Chief Complaint Chief Complaint  Patient presents with  . Hand Pain    HPI Mark Reeves is a 54 y.o. male.   54 year old male comes in for 1 week history of left hand pain after injury. States he hit his hand on something. There is swelling and redness to the 2nd MCP joint. No pain at rest, pain with movement. No numbness/tingling. Takes naproxen 440mg  QHS to help him sleep.     Past Medical History:  Diagnosis Date  . Diabetes mellitus   . Hypertension     There are no problems to display for this patient.   Past Surgical History:  Procedure Laterality Date  . HERNIA REPAIR    . KNEE SURGERY         Home Medications    Prior to Admission medications   Medication Sig Start Date End Date Taking? Authorizing Provider  pioglitazone (ACTOS) 30 MG tablet Take 30 mg by mouth daily.   Yes [provider]  aspirin 81 MG tablet Take 81 mg by mouth daily.    [provider]  lisinopril (PRINIVIL,ZESTRIL) 10 MG tablet Take 10 mg by mouth daily.    [provider]  meloxicam (MOBIC) 7.5 MG tablet Take 1 tablet (7.5 mg total) by mouth daily. 12/20/19   12/22/19, Hanzel Pizzo V, PA-C  metFORMIN (GLUCOPHAGE) 1000 MG tablet Take 1,000 mg by mouth 2 (two) times daily with a meal.    [provider]    Family History History reviewed. No pertinent family history.  Social History Social History   Tobacco Use  . Smoking status: Former [employer]ie [employer]  . Smokeless tobacco: Never Used  Substance Use Topics  . Alcohol use: Yes  . Drug use: Not Currently    Types: Marijuana     Allergies   Patient has no known allergies.   Review of Systems Review of Systems  Reason unable to perform ROS: See HPI as above.     Physical Exam Triage Vital Signs ED Triage Vitals [12/20/19 1557]  Enc Vitals Group     BP 127/82     Pulse Rate 83     Resp 18     Temp  98 F (36.7 C)     Temp Source Oral     SpO2 98 %     Weight      Height      Head Circumference      Peak Flow      Pain Score 1     Pain Loc      Pain Edu?      Excl. in GC?    No data found.  Updated Vital Signs BP 127/82 (BP Location: Left Arm)   Pulse 83   Temp 98 F (36.7 C) (Oral)   Resp 18   SpO2 98%   Physical Exam Constitutional:      General: He is not in acute distress.    Appearance: Normal appearance. He is well-developed. He is not toxic-appearing or diaphoretic.  HENT:     Head: Normocephalic and atraumatic.  Eyes:     Conjunctiva/sclera: Conjunctivae normal.     Pupils: Pupils are equal, round, and reactive to light.  Pulmonary:     Effort: Pulmonary effort is normal. No respiratory distress.  Musculoskeletal:     Cervical back: Normal range of motion and  neck supple.     Comments: Swelling with erythema to the left 2nd MCP joint. Point tenderness to the area. Full ROM. NVI  Skin:    General: Skin is warm and dry.  Neurological:     Mental Status: He is alert and oriented to person, place, and time.      UC Treatments / Results  Labs (all labs ordered are listed, but only abnormal results are displayed) Labs Reviewed - No data to display  EKG   Radiology DG Hand Complete Left  Result Date: 12/20/2019 CLINICAL DATA:  Pain.  Swelling at second MCP joint.  Trauma. EXAM: LEFT HAND - COMPLETE 3+ VIEW COMPARISON:  None. FINDINGS: No fractures. A radiopaque focus is seen in the soft tissues superficial to the distal second metatarsal located dorsally. No other acute abnormalities. IMPRESSION: 1. Small radiopaque foreign body in the soft tissues dorsal to the distal second metatarsal head. No fractures. Electronically Signed   By: Gerome Sam III M.D   On: 12/20/2019 16:59    Procedures Procedures (including critical care time)  Medications Ordered in UC Medications - No data to display  Initial Impression / Assessment and Plan / UC  Course  I have reviewed the triage vital signs and the nursing notes.  Pertinent labs & imaging results that were available during my care of the patient were reviewed by me and considered in my medical decision making (see chart for details).    Discussed small radiopaque area on x-ray.  Patient without wound to the finger, low suspicion for acute foreign body from recent injury.  Patient with swelling and erythema to the skin, no warmth, fluctuance.  Erythema has not spread since onset, lower suspicion for cellulitis.  Full range of motion of MCP joint, low suspicion for septic joint.  For now will splint for comfort, continue NSAIDs and ice compress.  Ortho follow-up if symptoms not improving.  Return precautions given.  Patient expresses understanding and agrees to plan.  Final Clinical Impressions(s) / UC Diagnoses   Final diagnoses:  Left hand pain   ED Prescriptions    Medication Sig Dispense Auth. Provider   meloxicam (MOBIC) 7.5 MG tablet Take 1 tablet (7.5 mg total) by mouth daily. 10 tablet Belinda Fisher, PA-C     I have reviewed the PDMP during this encounter.   Belinda Fisher, PA-C 12/20/19 1740

## 2020-02-21 DIAGNOSIS — Z01818 Encounter for other preprocedural examination: Secondary | ICD-10-CM | POA: Diagnosis not present

## 2020-02-26 DIAGNOSIS — H25813 Combined forms of age-related cataract, bilateral: Secondary | ICD-10-CM | POA: Diagnosis not present

## 2020-02-26 DIAGNOSIS — H52203 Unspecified astigmatism, bilateral: Secondary | ICD-10-CM | POA: Diagnosis not present

## 2020-02-26 DIAGNOSIS — H5203 Hypermetropia, bilateral: Secondary | ICD-10-CM | POA: Diagnosis not present

## 2020-02-26 DIAGNOSIS — E119 Type 2 diabetes mellitus without complications: Secondary | ICD-10-CM | POA: Diagnosis not present

## 2020-03-17 DIAGNOSIS — E559 Vitamin D deficiency, unspecified: Secondary | ICD-10-CM | POA: Diagnosis not present

## 2020-03-17 DIAGNOSIS — E538 Deficiency of other specified B group vitamins: Secondary | ICD-10-CM | POA: Diagnosis not present

## 2020-03-17 DIAGNOSIS — E782 Mixed hyperlipidemia: Secondary | ICD-10-CM | POA: Diagnosis not present

## 2020-03-17 DIAGNOSIS — I10 Essential (primary) hypertension: Secondary | ICD-10-CM | POA: Diagnosis not present

## 2020-03-17 DIAGNOSIS — E1165 Type 2 diabetes mellitus with hyperglycemia: Secondary | ICD-10-CM | POA: Diagnosis not present

## 2020-03-20 DIAGNOSIS — E782 Mixed hyperlipidemia: Secondary | ICD-10-CM | POA: Diagnosis not present

## 2020-03-20 DIAGNOSIS — E1165 Type 2 diabetes mellitus with hyperglycemia: Secondary | ICD-10-CM | POA: Diagnosis not present

## 2020-03-20 DIAGNOSIS — Z7984 Long term (current) use of oral hypoglycemic drugs: Secondary | ICD-10-CM | POA: Diagnosis not present

## 2020-03-20 DIAGNOSIS — Z79899 Other long term (current) drug therapy: Secondary | ICD-10-CM | POA: Diagnosis not present

## 2020-04-20 DIAGNOSIS — Z01812 Encounter for preprocedural laboratory examination: Secondary | ICD-10-CM | POA: Diagnosis not present

## 2020-04-23 DIAGNOSIS — Z1211 Encounter for screening for malignant neoplasm of colon: Secondary | ICD-10-CM | POA: Diagnosis not present

## 2020-04-23 DIAGNOSIS — K5730 Diverticulosis of large intestine without perforation or abscess without bleeding: Secondary | ICD-10-CM | POA: Diagnosis not present

## 2020-06-03 DIAGNOSIS — E782 Mixed hyperlipidemia: Secondary | ICD-10-CM | POA: Diagnosis not present

## 2020-06-03 DIAGNOSIS — I10 Essential (primary) hypertension: Secondary | ICD-10-CM | POA: Diagnosis not present

## 2020-06-03 DIAGNOSIS — E1165 Type 2 diabetes mellitus with hyperglycemia: Secondary | ICD-10-CM | POA: Diagnosis not present

## 2020-06-12 DIAGNOSIS — E538 Deficiency of other specified B group vitamins: Secondary | ICD-10-CM | POA: Diagnosis not present

## 2020-06-12 DIAGNOSIS — I10 Essential (primary) hypertension: Secondary | ICD-10-CM | POA: Diagnosis not present

## 2020-06-12 DIAGNOSIS — E1165 Type 2 diabetes mellitus with hyperglycemia: Secondary | ICD-10-CM | POA: Diagnosis not present

## 2020-06-12 DIAGNOSIS — E782 Mixed hyperlipidemia: Secondary | ICD-10-CM | POA: Diagnosis not present

## 2020-09-16 DIAGNOSIS — L570 Actinic keratosis: Secondary | ICD-10-CM | POA: Diagnosis not present

## 2020-09-16 DIAGNOSIS — D2262 Melanocytic nevi of left upper limb, including shoulder: Secondary | ICD-10-CM | POA: Diagnosis not present

## 2020-09-16 DIAGNOSIS — D225 Melanocytic nevi of trunk: Secondary | ICD-10-CM | POA: Diagnosis not present

## 2020-09-16 DIAGNOSIS — L821 Other seborrheic keratosis: Secondary | ICD-10-CM | POA: Diagnosis not present

## 2020-09-16 DIAGNOSIS — L814 Other melanin hyperpigmentation: Secondary | ICD-10-CM | POA: Diagnosis not present

## 2020-12-15 DIAGNOSIS — E7800 Pure hypercholesterolemia, unspecified: Secondary | ICD-10-CM | POA: Diagnosis not present

## 2020-12-15 DIAGNOSIS — M720 Palmar fascial fibromatosis [Dupuytren]: Secondary | ICD-10-CM | POA: Diagnosis not present

## 2020-12-15 DIAGNOSIS — Z23 Encounter for immunization: Secondary | ICD-10-CM | POA: Diagnosis not present

## 2020-12-15 DIAGNOSIS — N529 Male erectile dysfunction, unspecified: Secondary | ICD-10-CM | POA: Diagnosis not present

## 2020-12-15 DIAGNOSIS — E1169 Type 2 diabetes mellitus with other specified complication: Secondary | ICD-10-CM | POA: Diagnosis not present

## 2020-12-15 DIAGNOSIS — Z125 Encounter for screening for malignant neoplasm of prostate: Secondary | ICD-10-CM | POA: Diagnosis not present

## 2020-12-15 DIAGNOSIS — Z0000 Encounter for general adult medical examination without abnormal findings: Secondary | ICD-10-CM | POA: Diagnosis not present

## 2021-01-04 DIAGNOSIS — U071 COVID-19: Secondary | ICD-10-CM | POA: Diagnosis not present

## 2021-01-07 DIAGNOSIS — Z20822 Contact with and (suspected) exposure to covid-19: Secondary | ICD-10-CM | POA: Diagnosis not present

## 2021-03-02 DIAGNOSIS — H2513 Age-related nuclear cataract, bilateral: Secondary | ICD-10-CM | POA: Diagnosis not present

## 2021-03-02 DIAGNOSIS — E119 Type 2 diabetes mellitus without complications: Secondary | ICD-10-CM | POA: Diagnosis not present

## 2021-06-15 DIAGNOSIS — E1169 Type 2 diabetes mellitus with other specified complication: Secondary | ICD-10-CM | POA: Diagnosis not present

## 2021-06-15 DIAGNOSIS — I10 Essential (primary) hypertension: Secondary | ICD-10-CM | POA: Diagnosis not present

## 2021-06-15 DIAGNOSIS — Z23 Encounter for immunization: Secondary | ICD-10-CM | POA: Diagnosis not present

## 2021-06-15 DIAGNOSIS — E7800 Pure hypercholesterolemia, unspecified: Secondary | ICD-10-CM | POA: Diagnosis not present

## 2021-06-18 DIAGNOSIS — E1169 Type 2 diabetes mellitus with other specified complication: Secondary | ICD-10-CM | POA: Diagnosis not present

## 2021-06-18 DIAGNOSIS — L089 Local infection of the skin and subcutaneous tissue, unspecified: Secondary | ICD-10-CM | POA: Diagnosis not present

## 2021-09-21 DIAGNOSIS — D225 Melanocytic nevi of trunk: Secondary | ICD-10-CM | POA: Diagnosis not present

## 2021-09-21 DIAGNOSIS — D485 Neoplasm of uncertain behavior of skin: Secondary | ICD-10-CM | POA: Diagnosis not present

## 2021-09-21 DIAGNOSIS — L821 Other seborrheic keratosis: Secondary | ICD-10-CM | POA: Diagnosis not present

## 2021-09-21 DIAGNOSIS — C44229 Squamous cell carcinoma of skin of left ear and external auricular canal: Secondary | ICD-10-CM | POA: Diagnosis not present

## 2021-09-21 DIAGNOSIS — D2262 Melanocytic nevi of left upper limb, including shoulder: Secondary | ICD-10-CM | POA: Diagnosis not present

## 2021-09-21 DIAGNOSIS — L570 Actinic keratosis: Secondary | ICD-10-CM | POA: Diagnosis not present

## 2021-09-21 DIAGNOSIS — L814 Other melanin hyperpigmentation: Secondary | ICD-10-CM | POA: Diagnosis not present

## 2021-10-14 DIAGNOSIS — C44229 Squamous cell carcinoma of skin of left ear and external auricular canal: Secondary | ICD-10-CM | POA: Diagnosis not present

## 2021-12-06 DIAGNOSIS — J069 Acute upper respiratory infection, unspecified: Secondary | ICD-10-CM | POA: Diagnosis not present

## 2021-12-29 DIAGNOSIS — Z125 Encounter for screening for malignant neoplasm of prostate: Secondary | ICD-10-CM | POA: Diagnosis not present

## 2021-12-29 DIAGNOSIS — E1169 Type 2 diabetes mellitus with other specified complication: Secondary | ICD-10-CM | POA: Diagnosis not present

## 2021-12-29 DIAGNOSIS — Z23 Encounter for immunization: Secondary | ICD-10-CM | POA: Diagnosis not present

## 2021-12-29 DIAGNOSIS — I10 Essential (primary) hypertension: Secondary | ICD-10-CM | POA: Diagnosis not present

## 2021-12-29 DIAGNOSIS — Z0000 Encounter for general adult medical examination without abnormal findings: Secondary | ICD-10-CM | POA: Diagnosis not present

## 2021-12-29 DIAGNOSIS — E7800 Pure hypercholesterolemia, unspecified: Secondary | ICD-10-CM | POA: Diagnosis not present

## 2021-12-29 DIAGNOSIS — N529 Male erectile dysfunction, unspecified: Secondary | ICD-10-CM | POA: Diagnosis not present

## 2022-02-07 DIAGNOSIS — N401 Enlarged prostate with lower urinary tract symptoms: Secondary | ICD-10-CM | POA: Diagnosis not present

## 2022-02-07 DIAGNOSIS — N3943 Post-void dribbling: Secondary | ICD-10-CM | POA: Diagnosis not present

## 2022-03-08 DIAGNOSIS — H2513 Age-related nuclear cataract, bilateral: Secondary | ICD-10-CM | POA: Diagnosis not present

## 2022-03-08 DIAGNOSIS — E119 Type 2 diabetes mellitus without complications: Secondary | ICD-10-CM | POA: Diagnosis not present

## 2022-03-08 DIAGNOSIS — H524 Presbyopia: Secondary | ICD-10-CM | POA: Diagnosis not present

## 2022-05-09 DIAGNOSIS — N35812 Other urethral bulbous stricture, male: Secondary | ICD-10-CM | POA: Diagnosis not present

## 2022-05-10 ENCOUNTER — Other Ambulatory Visit: Payer: Self-pay | Admitting: Urology

## 2022-05-13 ENCOUNTER — Other Ambulatory Visit: Payer: Self-pay

## 2022-05-13 ENCOUNTER — Encounter (HOSPITAL_BASED_OUTPATIENT_CLINIC_OR_DEPARTMENT_OTHER): Payer: Self-pay | Admitting: Urology

## 2022-05-13 NOTE — Progress Notes (Signed)
Spoke w/ via phone for pre-op interview: patient Lab needs dos: EKG, BMP Lab results: NA COVID test: patient states asymptomatic no test needed. Arrive at 0945 05/25/22 NPO after MN except clear liquids.Clear liquids from MN until 0845 Med rec completed. Medications to take morning of surgery: none; hold Trulicity, B12, and aspirin 1 week prior to sx (patient states he is taking aspirin electively) Diabetic medication: do not take morning of surgery Patient instructed to bring photo id and insurance card day of surgery. Patient aware to have Driver (ride ) / caregiver for 24 hours after surgery. (Wife to drive) Patient Special Instructions: NA Pre-Op special Istructions:NA Patient verbalized understanding of instructions that were given at this phone interview. Patient denies shortness of breath, chest pain, fever, cough at this phone interview.

## 2022-05-25 ENCOUNTER — Encounter (HOSPITAL_BASED_OUTPATIENT_CLINIC_OR_DEPARTMENT_OTHER)
Admission: RE | Disposition: A | Discharge status: Home / Self Care | Payer: Self-pay | Source: Home / Self Care | Attending: Urology

## 2022-05-25 ENCOUNTER — Ambulatory Visit (HOSPITAL_BASED_OUTPATIENT_CLINIC_OR_DEPARTMENT_OTHER): Payer: BC Managed Care – PPO | Admitting: Anesthesiology

## 2022-05-25 ENCOUNTER — Ambulatory Visit (HOSPITAL_BASED_OUTPATIENT_CLINIC_OR_DEPARTMENT_OTHER)
Admission: RE | Admit: 2022-05-25 | Discharge: 2022-05-25 | Disposition: A | Discharge status: Home / Self Care | Payer: BC Managed Care – PPO | Attending: Urology | Admitting: Urology

## 2022-05-25 ENCOUNTER — Encounter (HOSPITAL_BASED_OUTPATIENT_CLINIC_OR_DEPARTMENT_OTHER): Payer: Self-pay | Admitting: Urology

## 2022-05-25 ENCOUNTER — Other Ambulatory Visit: Payer: Self-pay

## 2022-05-25 DIAGNOSIS — Z7984 Long term (current) use of oral hypoglycemic drugs: Secondary | ICD-10-CM | POA: Insufficient documentation

## 2022-05-25 DIAGNOSIS — R3912 Poor urinary stream: Secondary | ICD-10-CM | POA: Insufficient documentation

## 2022-05-25 DIAGNOSIS — I10 Essential (primary) hypertension: Secondary | ICD-10-CM | POA: Insufficient documentation

## 2022-05-25 DIAGNOSIS — Z8249 Family history of ischemic heart disease and other diseases of the circulatory system: Secondary | ICD-10-CM | POA: Diagnosis not present

## 2022-05-25 DIAGNOSIS — Z794 Long term (current) use of insulin: Secondary | ICD-10-CM | POA: Diagnosis not present

## 2022-05-25 DIAGNOSIS — Z87891 Personal history of nicotine dependence: Secondary | ICD-10-CM | POA: Diagnosis not present

## 2022-05-25 DIAGNOSIS — N35912 Unspecified bulbous urethral stricture, male: Secondary | ICD-10-CM

## 2022-05-25 DIAGNOSIS — N401 Enlarged prostate with lower urinary tract symptoms: Secondary | ICD-10-CM | POA: Diagnosis not present

## 2022-05-25 DIAGNOSIS — R351 Nocturia: Secondary | ICD-10-CM | POA: Diagnosis not present

## 2022-05-25 DIAGNOSIS — Z7985 Long-term (current) use of injectable non-insulin antidiabetic drugs: Secondary | ICD-10-CM | POA: Insufficient documentation

## 2022-05-25 DIAGNOSIS — E669 Obesity, unspecified: Secondary | ICD-10-CM | POA: Insufficient documentation

## 2022-05-25 DIAGNOSIS — E119 Type 2 diabetes mellitus without complications: Secondary | ICD-10-CM | POA: Diagnosis not present

## 2022-05-25 DIAGNOSIS — Z6832 Body mass index (BMI) 32.0-32.9, adult: Secondary | ICD-10-CM | POA: Insufficient documentation

## 2022-05-25 DIAGNOSIS — Z01818 Encounter for other preprocedural examination: Secondary | ICD-10-CM

## 2022-05-25 HISTORY — PX: CYSTOSCOPY WITH RETROGRADE URETHROGRAM: SHX6309

## 2022-05-25 LAB — POCT I-STAT, CHEM 8
BUN: 12 mg/dL (ref 6–20)
Calcium, Ion: 1.32 mmol/L (ref 1.15–1.40)
Chloride: 100 mmol/L (ref 98–111)
Creatinine, Ser: 0.9 mg/dL (ref 0.61–1.24)
Glucose, Bld: 241 mg/dL — ABNORMAL HIGH (ref 70–99)
HCT: 47 % (ref 39.0–52.0)
Hemoglobin: 16 g/dL (ref 13.0–17.0)
Potassium: 4.2 mmol/L (ref 3.5–5.1)
Sodium: 140 mmol/L (ref 135–145)
TCO2: 25 mmol/L (ref 22–32)

## 2022-05-25 LAB — GLUCOSE, CAPILLARY: Glucose-Capillary: 201 mg/dL — ABNORMAL HIGH (ref 70–99)

## 2022-05-25 SURGERY — CYSTOSCOPY WITH RETROGRADE URETHROGRAM
Anesthesia: General

## 2022-05-25 MED ORDER — CIPROFLOXACIN IN D5W 400 MG/200ML IV SOLN
400.0000 mg | INTRAVENOUS | Status: AC
  Administered 2022-05-25: 400 mg via INTRAVENOUS

## 2022-05-25 MED ORDER — ACETAMINOPHEN 500 MG PO TABS
1000.0000 mg | ORAL_TABLET | Freq: Once | ORAL | Status: AC
  Administered 2022-05-25: 1000 mg via ORAL

## 2022-05-25 MED ORDER — LIDOCAINE HCL (PF) 2 % IJ SOLN
INTRAMUSCULAR | Status: AC
  Filled 2022-05-25: qty 5

## 2022-05-25 MED ORDER — INSULIN ASPART 100 UNIT/ML IJ SOLN
INTRAMUSCULAR | Status: AC
  Filled 2022-05-25: qty 1

## 2022-05-25 MED ORDER — EPHEDRINE SULFATE (PRESSORS) 50 MG/ML IJ SOLN
INTRAMUSCULAR | Status: DC | PRN
  Administered 2022-05-25: 10 mg via INTRAVENOUS

## 2022-05-25 MED ORDER — PROMETHAZINE HCL 25 MG/ML IJ SOLN: 6.2500 mg | INTRAMUSCULAR | Status: DC | PRN

## 2022-05-25 MED ORDER — OXYCODONE HCL 5 MG PO TABS: 5.0000 mg | ORAL_TABLET | Freq: Once | ORAL | Status: DC | PRN

## 2022-05-25 MED ORDER — MIDAZOLAM HCL 5 MG/5ML IJ SOLN
INTRAMUSCULAR | Status: DC | PRN
  Administered 2022-05-25: 2 mg via INTRAVENOUS

## 2022-05-25 MED ORDER — MIDAZOLAM HCL 2 MG/2ML IJ SOLN
INTRAMUSCULAR | Status: AC
  Filled 2022-05-25: qty 2

## 2022-05-25 MED ORDER — SODIUM CHLORIDE 0.9 % IR SOLN
Status: DC | PRN
  Administered 2022-05-25: 3000 mL

## 2022-05-25 MED ORDER — KETOROLAC TROMETHAMINE 30 MG/ML IJ SOLN
INTRAMUSCULAR | Status: DC | PRN
  Administered 2022-05-25: 30 mg via INTRAVENOUS

## 2022-05-25 MED ORDER — DEXAMETHASONE SODIUM PHOSPHATE 10 MG/ML IJ SOLN
INTRAMUSCULAR | Status: AC
  Filled 2022-05-25: qty 1

## 2022-05-25 MED ORDER — ONDANSETRON HCL 4 MG/2ML IJ SOLN
INTRAMUSCULAR | Status: DC | PRN
  Administered 2022-05-25: 4 mg via INTRAVENOUS

## 2022-05-25 MED ORDER — FENTANYL CITRATE (PF) 100 MCG/2ML IJ SOLN
INTRAMUSCULAR | Status: AC
  Filled 2022-05-25: qty 2

## 2022-05-25 MED ORDER — IOHEXOL 300 MG/ML  SOLN
INTRAMUSCULAR | Status: DC | PRN
  Administered 2022-05-25: 35 mL

## 2022-05-25 MED ORDER — STERILE WATER FOR IRRIGATION IR SOLN
Status: DC | PRN
  Administered 2022-05-25: 500 mL

## 2022-05-25 MED ORDER — LIDOCAINE HCL (CARDIAC) PF 100 MG/5ML IV SOSY
PREFILLED_SYRINGE | INTRAVENOUS | Status: DC | PRN
  Administered 2022-05-25: 60 mg via INTRAVENOUS

## 2022-05-25 MED ORDER — PROPOFOL 10 MG/ML IV BOLUS
INTRAVENOUS | Status: DC | PRN
  Administered 2022-05-25: 200 mg via INTRAVENOUS

## 2022-05-25 MED ORDER — INSULIN ASPART 100 UNIT/ML IJ SOLN
6.0000 [IU] | Freq: Once | INTRAMUSCULAR | Status: AC
  Administered 2022-05-25: 6 [IU] via SUBCUTANEOUS

## 2022-05-25 MED ORDER — PROPOFOL 10 MG/ML IV BOLUS
INTRAVENOUS | Status: AC
  Filled 2022-05-25: qty 20

## 2022-05-25 MED ORDER — LACTATED RINGERS IV SOLN: INTRAVENOUS | Status: DC

## 2022-05-25 MED ORDER — OXYCODONE HCL 5 MG/5ML PO SOLN: 5.0000 mg | Freq: Once | ORAL | Status: DC | PRN

## 2022-05-25 MED ORDER — EPHEDRINE 5 MG/ML INJ
INTRAVENOUS | Status: AC
  Filled 2022-05-25: qty 5

## 2022-05-25 MED ORDER — ONDANSETRON HCL 4 MG/2ML IJ SOLN
INTRAMUSCULAR | Status: AC
  Filled 2022-05-25: qty 2

## 2022-05-25 MED ORDER — ACETAMINOPHEN 500 MG PO TABS
ORAL_TABLET | ORAL | Status: AC
  Filled 2022-05-25: qty 2

## 2022-05-25 MED ORDER — FENTANYL CITRATE (PF) 100 MCG/2ML IJ SOLN: 25.0000 ug | INTRAMUSCULAR | Status: DC | PRN

## 2022-05-25 MED ORDER — CIPROFLOXACIN IN D5W 400 MG/200ML IV SOLN
INTRAVENOUS | Status: AC
  Filled 2022-05-25: qty 200

## 2022-05-25 MED ORDER — FENTANYL CITRATE (PF) 100 MCG/2ML IJ SOLN
INTRAMUSCULAR | Status: DC | PRN
  Administered 2022-05-25 (×4): 25 ug via INTRAVENOUS

## 2022-05-25 SURGICAL SUPPLY — 30 items
BAG DRAIN URO-CYSTO SKYTR STRL (DRAIN) ×2 IMPLANT
BAG DRN RND TRDRP ANRFLXCHMBR (UROLOGICAL SUPPLIES)
BAG DRN UROCATH (DRAIN) ×1
BAG URINE DRAIN 2000ML AR STRL (UROLOGICAL SUPPLIES) IMPLANT
BAG URINE LEG 500ML (DRAIN) IMPLANT
BALLN NEPHROMAX 24X8X12 (BALLOONS)
BALLN NEPHROSTOMY (BALLOONS)
BALLN OPTILUME DCB 30X3X75 (BALLOONS)
BALLN OPTILUME DCB 30X5X75 (BALLOONS) ×1
BALLOON NEPHROMAX 24X8X12 (BALLOONS) IMPLANT
BALLOON NEPHROSTOMY (BALLOONS) IMPLANT
BALLOON OPTILUME DCB 30X3X75 (BALLOONS) IMPLANT
BALLOON OPTILUME DCB 30X5X75 (BALLOONS) IMPLANT
CATH COUDE FOLEY 2W 5CC 18FR (CATHETERS) IMPLANT
CATH FOLEY 2WAY SLVR  5CC 14FR (CATHETERS) ×1
CATH FOLEY 2WAY SLVR  5CC 16FR (CATHETERS)
CATH FOLEY 2WAY SLVR 5CC 14FR (CATHETERS) IMPLANT
CATH FOLEY 2WAY SLVR 5CC 16FR (CATHETERS) IMPLANT
CLOTH BEACON ORANGE TIMEOUT ST (SAFETY) ×2 IMPLANT
GLOVE BIO SURGEON STRL SZ7.5 (GLOVE) ×2 IMPLANT
GOWN STRL REUS W/TWL LRG LVL3 (GOWN DISPOSABLE) ×2 IMPLANT
GUIDEWIRE STR DUAL SENSOR (WIRE) ×2 IMPLANT
HOLDER FOLEY CATH W/STRAP (MISCELLANEOUS) IMPLANT
IV NS IRRIG 3000ML ARTHROMATIC (IV SOLUTION) IMPLANT
KIT TURNOVER CYSTO (KITS) ×2 IMPLANT
MANIFOLD NEPTUNE II (INSTRUMENTS) ×2 IMPLANT
NS IRRIG 500ML POUR BTL (IV SOLUTION) IMPLANT
PACK CYSTO (CUSTOM PROCEDURE TRAY) ×2 IMPLANT
SLEEVE SCD COMPRESS KNEE MED (STOCKING) ×2 IMPLANT
TUBE CONNECTING 12X1/4 (SUCTIONS) IMPLANT

## 2022-05-25 NOTE — Anesthesia Procedure Notes (Signed)
Procedure Name: LMA Insertion Date/Time: 05/25/2022 12:08 PM  Performed by: Jessica Priest, CRNAPre-anesthesia Checklist: Patient identified, Emergency Drugs available, Suction available, Patient being monitored and Timeout performed Patient Re-evaluated:Patient Re-evaluated prior to induction Oxygen Delivery Method: Circle system utilized Preoxygenation: Pre-oxygenation with 100% oxygen Induction Type: IV induction Ventilation: Mask ventilation without difficulty LMA: LMA with gastric port inserted LMA Size: 5.0 Number of attempts: 2 Airway Equipment and Method: Bite block Placement Confirmation: positive ETCO2, breath sounds checked- equal and bilateral and CO2 detector Tube secured with: Tape Dental Injury: Teeth and Oropharynx as per pre-operative assessment

## 2022-05-25 NOTE — Anesthesia Postprocedure Evaluation (Signed)
Anesthesia Post Note  Patient: Mark Reeves  Procedure(s) Performed: CYSTOSCOPY WITH RETROGRADE URETHROGRAM AND OPTILUME BALLOON DILATION     Patient location during evaluation: PACU Anesthesia Type: General Level of consciousness: awake and alert Pain management: pain level controlled Vital Signs Assessment: post-procedure vital signs reviewed and stable Respiratory status: spontaneous breathing, nonlabored ventilation and respiratory function stable Cardiovascular status: stable and blood pressure returned to baseline Anesthetic complications: no   No notable events documented.  Last Vitals:  Vitals:   05/25/22 1315 05/25/22 1340  BP: 121/83 114/87  Pulse: 67 72  Resp: 16 16  Temp:  36.4 C  SpO2: 97% 98%    Last Pain:  Vitals:   05/25/22 1340  TempSrc:   PainSc: 0-No pain                 Beryle Lathe

## 2022-05-25 NOTE — Interval H&P Note (Signed)
History and Physical Interval Note:  05/25/2022 11:51 AM  Mark Reeves  has presented today for surgery, with the diagnosis of BULBAR URETHRAL STRICTURE.  The various methods of treatment have been discussed with the patient and family. After consideration of risks, benefits and other options for treatment, the patient has consented to  Procedure(s): CYSTOSCOPY WITH RETROGRADE URETHROGRAM AND OPTILUME BALLOON DILATION (N/A) as a surgical intervention.  The patient's history has been reviewed, patient examined, no change in status, stable for surgery.  I have reviewed the patient's chart and labs.  Questions were answered to the patient's satisfaction.     Crist Fat

## 2022-05-25 NOTE — Transfer of Care (Signed)
Immediate Anesthesia Transfer of Care Note  Patient: Mark Reeves  Procedure(s) Performed: Procedure(s) (LRB): CYSTOSCOPY WITH RETROGRADE URETHROGRAM AND OPTILUME BALLOON DILATION (N/A)  Patient Location: PACU  Anesthesia Type: General  Level of Consciousness: awake, sedated, patient cooperative and responds to stimulation  Airway & Oxygen Therapy: Patient Spontanous Breathing and Patient connected to [place] oxygen  Post-op Assessment: Report given to PACU RN, Post -op Vital signs reviewed and stable and Patient moving all extremities  Post vital signs: Reviewed and stable  Complications: No apparent anesthesia complications

## 2022-05-25 NOTE — Discharge Instructions (Addendum)
Removal of catheter Remove the foley catheter in 24 hours.  This can be done easily by cutting the side port of the catheter, which will allow the balloon to deflate.  You will see 1-2 teaspoons of clear water as the balloon deflates and then the catheter can be slid out without difficulty.        Cut here   Foley Catheter Care A soft, flexible tube (Foley catheter) may have been placed in your bladder to drain urine and fluid. Follow these instructions: Taking Care of the Catheter Keep the area where the catheter leaves your body clean.  Attach the catheter to the leg so there is no tension on the catheter.  Keep the drainage bag below the level of the bladder, but keep it OFF the floor.  Do not take long soaking baths. Your caregiver will give instructions about showering.  Wash your hands before touching ANYTHING related to the catheter or bag.  Using mild soap and warm water on a washcloth:  Clean the area closest to the catheter insertion site using a circular motion around the catheter.  Clean the catheter itself by wiping AWAY from the insertion site for several inches down the tube.  NEVER wipe upward as this could sweep bacteria up into the urethra (tube in your body that normally drains the bladder) and cause infection.  Place a small amount of sterile lubricant at the tip of the penis where the catheter is entering.  Taking Care of the Drainage Bags Two drainage bags may be taken home: a large overnight drainage bag, and a smaller leg bag which fits underneath clothing.  It is okay to wear the overnight bag at any time, but NEVER wear the smaller leg bag at night.  Keep the drainage bag well below the level of your bladder. This prevents backflow of urine into the bladder and allows the urine to drain freely.  Anchor the tubing to your leg to prevent pulling or tension on the catheter. Use tape or a leg strap provided by the hospital.  Empty the drainage bag when it is 1/2 to 3/4  full. Wash your hands before and after touching the bag.  Periodically check the tubing for kinks to make sure there is no pressure on the tubing which could restrict the flow of urine.  Changing the Drainage Bags Cleanse both ends of the clean bag with alcohol before changing.  Pinch off the rubber catheter to avoid urine spillage during the disconnection.  Disconnect the dirty bag and connect the clean one.  Empty the dirty bag carefully to avoid a urine spill.  Attach the new bag to the leg with tape or a leg strap.  Cleaning the Drainage Bags Whenever a drainage bag is disconnected, it must be cleaned quickly so it is ready for the next use.  Wash the bag in warm, soapy water.  Rinse the bag thoroughly with warm water.  Soak the bag for 30 minutes in a solution of white vinegar and water (1 cup vinegar to 1 quart warm water).  Rinse with warm water.  SEEK MEDICAL CARE IF:  You have chills or night sweats.  You are leaking around your catheter or have problems with your catheter. It is not uncommon to have sporadic leakage around your catheter as a result of bladder spasms. If the leakage stops, there is not much need for concern. If you are uncertain, call your caregiver.  You develop side effects that you think are  coming from your medicines.  SEEK IMMEDIATE MEDICAL CARE IF:  You are suddenly unable to urinate. Check to see if there are any kinks in the drainage tubing that may cause this. If you cannot find any kinks, call your caregiver immediately. This is an emergency.  You develop shortness of breath or chest pains.  Bleeding persists or clots develop in your urine.  You have a fever.  You develop pain in your back or over your lower belly (abdomen).  You develop pain or swelling in your legs.  Any problems you are having get worse rather than better.  MAKE SURE YOU:  Understand these instructions.  Will watch your condition.  Will get help right away if you are not doing well  or get worse.       No acetaminophen/Tylenol until after 4:30 pm today if needed.  No ibuprofen, Advil, Aleve, Motrin, ketorolac, meloxicam, naproxen, or other NSAIDS until after 6:30 pm today if needed.     Post Anesthesia Home Care Instructions  Activity: Get plenty of rest for the remainder of the day. A responsible individual must stay with you for 24 hours following the procedure.  For the next 24 hours, DO NOT: -Drive a car -Advertising copywriter -Drink alcoholic beverages -Take any medication unless instructed by your physician -Make any legal decisions or sign important papers.  Meals: Start with liquid foods such as gelatin or soup. Progress to regular foods as tolerated. Avoid greasy, spicy, heavy foods. If nausea and/or vomiting occur, drink only clear liquids until the nausea and/or vomiting subsides. Call your physician if vomiting continues.  Special Instructions/Symptoms: Your throat may feel dry or sore from the anesthesia or the breathing tube placed in your throat during surgery. If this causes discomfort, gargle with warm salt water. The discomfort should disappear within 24 hours.

## 2022-05-25 NOTE — Anesthesia Preprocedure Evaluation (Addendum)
Anesthesia Evaluation  Patient identified by MRN, date of birth, ID band Patient awake    Reviewed: Allergy & Precautions, NPO status , Patient's Chart, lab work & pertinent test results  History of Anesthesia Complications Negative for: history of anesthetic complications  Airway Mallampati: I  TM Distance: >3 FB Neck ROM: Full    Dental  (+) Dental Advisory Given, Teeth Intact   Pulmonary former smoker   Pulmonary exam normal        Cardiovascular hypertension, Pt. on medications Normal cardiovascular exam     Neuro/Psych negative neurological ROS  negative psych ROS   GI/Hepatic negative GI ROS, Neg liver ROS,,,  Endo/Other  diabetes, Type 2, Insulin Dependent   Obesity   Renal/GU negative Renal ROS     Musculoskeletal negative musculoskeletal ROS (+)    Abdominal   Peds  Hematology negative hematology ROS (+)   Anesthesia Other Findings On GLP1a   Reproductive/Obstetrics                             Anesthesia Physical Anesthesia Plan  ASA: 2  Anesthesia Plan: General   Post-op Pain Management: Tylenol PO (pre-op)*   Induction: Intravenous  PONV Risk Score and Plan: 2 and Treatment may vary due to age or medical condition, Ondansetron, Dexamethasone and Midazolam  Airway Management Planned: LMA  Additional Equipment: None  Intra-op Plan:   Post-operative Plan: Extubation in OR  Informed Consent: I have reviewed the patients History and Physical, chart, labs and discussed the procedure including the risks, benefits and alternatives for the proposed anesthesia with the patient or authorized representative who has indicated his/her understanding and acceptance.     Dental advisory given  Plan Discussed with: CRNA and Anesthesiologist  Anesthesia Plan Comments:        Anesthesia Quick Evaluation

## 2022-05-25 NOTE — H&P (Signed)
Voiding Symptoms  HPI: Mark Reeves is a 57 year-old male established patient who is here today for further eval and management of BPH and lower tract symptoms.  He does not have frequency. He has nocturia 1 or 2 times per night. The patient states his most bothersome symptom(s) are the following: weak stream.   He does not have to wait a long time to start his urinary stream. His urinary stream does start and stop during voiding. He feels that he does empty his bladder. He denies any other associated symptoms. The patient has a history of constipation.   Patient states he has not had urinary retention in the past. He does not have a history of urinary infections.   The patient's most recent PSA was 0.43. This was drawn on 12/29/2021. Family members with diagnosed prostate cancer include his father.   At times split stream. The patient had a DVIU twice once in the late 80s, the last 1 was in 1994. He states that after the last procedure his symptoms quickly returned to their baseline of a weak stream. He is complaining of postvoid dribble. He does not seem to have a lot of other real significant and annoying symptoms.   The patient does drink a substantial amount of coffee, 7 cups/day. He also drinks 64 ounces of water. Denies any urge incontinence.   Interval: The patient is here for 41-month follow-up. At his last office visit we started him on tamsulosin, he took it for 6 weeks without any noticeable difference. He also was having severe retrograde ejaculation. He continues to have weak stream/split stream. He is anxious to proceed with additional steps. Denies any worsening incontinence, hematuria, or dysuria     AUA Symptom Score: He never has the sensation of not emptying his bladder completely after finishing urinating. Less than 20% of the time he has to urinate again fewer than two hours after he has finished urinating. Less than 20% of the time he has to start and stop again several times  when he urinates. He never finds it difficult to postpone urination. More than 50% of the time he has a weak urinary stream. He never has to push or strain to begin urination. He has to get up to urinate 2 times from the time he goes to bed until the time he gets up in the morning.   Calculated AUA Symptom Score: 8    QOL Score: He would feel mixed if he had to live with his urinary condition the way it is now for the rest of his life.   Calculated QOL Symptom Score: 3    ALLERGIES: No Allergies Prednisone    MEDICATIONS: Lisinopril 10 mg tablet  Metformin Hcl 500 mg tablet  Basaglar Kwikpen U-100 100 unit/ml (3 ml) insulin pen  Flonase Allergy Relief 50 mcg/actuation spray, suspension  Jardiance 25 mg tablet  Low Dose Aspirin Ec 81 mg tablet, delayed release  Pioglitazone Hcl 30 mg tablet  Rosuvastatin Calcium 10 mg tablet  Tadalafil 20 mg tablet  Trulicity 3 mg/0.5 ml pen injector  Vitamin B12  Vitamin D3     GU PSH: Rpr Umbil Hern; Reduc < 5 Yr - 2009       PSH Notes: Umbilical Hernia Repair, Knee Arthroplasty (2015)   NON-GU PSH: None   GU PMH: BPH w/LUTS - 02/07/2022 Post-void dribbling - 02/07/2022 Urethral Stricture, Unspec - 02/07/2022, Urethral stricture, - 2014 Ureteral calculus, Calculus of ureter - 2014  PMH Notes: Dupuytren's contracture- both hands   1898-01-[birth year] 00:00:00 - Note: Normal Routine History And Physical Adult   NON-GU PMH: Glycosuria, Glycosuria - 2014 Diabetes Type 2 Hypercholesterolemia Hypertension Skin Cancer, History    FAMILY HISTORY: 3 Son's - Son Heart Attack - Father Heartburn With Regurgitation - Mother nephrolithiasis - Father Prostate Cancer - Father   SOCIAL HISTORY: Marital Status: Married Preferred Language: English; Ethnicity: Not Hispanic Or Latino; Race: White Current Smoking Status: Patient does not smoke anymore. Has not smoked since 02/01/1988. Smoked for 6 years. Smoked less than 1/2 pack per day.   Tobacco Use  Assessment Completed: Used Tobacco in last 30 days? Drinks 1 drink per day. Light Drinker.  Drinks 4+ caffeinated drinks per day. Patient's occupation is/was [employer].     Notes: Previous History Of Smoking, Tobacco Use, Marital History - Currently Married, Alcohol Use, Caffeine Use, Occupation:   REVIEW OF SYSTEMS:    GU Review Male:   Patient denies frequent urination, burning/ pain with urination, get up at night to urinate, trouble starting your stream, erection problems, hard to postpone urination, have to strain to urinate , stream starts and stops, penile pain, and leakage of urine.  Gastrointestinal (Upper):   Patient denies nausea, vomiting, and indigestion/ heartburn.  Gastrointestinal (Lower):   Patient denies diarrhea and constipation.  Constitutional:   Patient denies fever, night sweats, weight loss, and fatigue.  Skin:   Patient denies skin rash/ lesion and itching.  Eyes:   Patient denies blurred vision and double vision.  Ears/ Nose/ Throat:   Patient denies sore throat and sinus problems.  Hematologic/Lymphatic:   Patient denies swollen glands and easy bruising.  Cardiovascular:   Patient denies leg swelling and chest pains.  Respiratory:   Patient denies cough and shortness of breath.  Endocrine:   Patient denies excessive thirst.  Musculoskeletal:   Patient denies back pain and joint pain.  Neurological:   Patient denies headaches and dizziness.  Psychologic:   Patient denies depression and anxiety.   VITAL SIGNS: None   MULTI-SYSTEM PHYSICAL EXAMINATION:    Constitutional: Well-nourished. No physical deformities. Normally developed. Good grooming.  Respiratory: Normal breath sounds. No labored breathing, no use of accessory muscles.   Cardiovascular: Regular rate and rhythm. No murmur, no gallop. Normal temperature, normal extremity pulses, no swelling, no varicosities.      Complexity of Data:  Source Of History:  Patient  Records Review:    Previous Doctor Records, Previous Patient Records, POC Tool  Urine Test Review:   Urinalysis   PROCEDURES:         Flexible Cystoscopy - 52000  Risks, benefits, and some of the potential complications of the procedure were discussed at length with the patient including infection, bleeding, voiding discomfort, urinary retention, fever, chills, sepsis, and others. All questions were answered. Informed consent was obtained. Sterile technique and intraurethral analgesia were used.  Meatus:  Normal size. Normal location. Normal condition.  Urethra:  Narrow anterior urethra with moderate bulbous stricture  External Sphincter:  Normal.      The lower urinary tract was carefully examined. The procedure was well-tolerated and without complications. Antibiotic instructions were given. Instructions were given to call the office immediately for bloody urine, difficulty urinating, urinary retention, painful or frequent urination, fever, chills, nausea, vomiting or other illness. The patient stated that he understood these instructions and would comply with them.         Urinalysis Dipstick Dipstick Cont'd  Color: Yellow Bilirubin:  Neg mg/dL  Appearance: Clear Ketones: Trace mg/dL  Specific Gravity: 1.610 Blood: Neg ery/uL  pH: 5.5 Protein: Neg mg/dL  Glucose: 2+ mg/dL Urobilinogen: 0.2 mg/dL    Nitrites: Neg    Leukocyte Esterase: Neg leu/uL    ASSESSMENT:      ICD-10 Details  1 GU:   Urethral Stricture, Unspec - N35.9    PLAN:            Medications Stop Meds: Tamsulosin Hcl 0.4 mg capsule 1 capsule PO Daily  Start: 02/07/2022  Stop: 02/02/2023   Discontinue: 05/09/2022  - Reason: Did not like how it made him feel The medication was ineffective.            Document Letter(s):  Created for Patient: Clinical Summary         Notes:   The patient has a bulbar urethral stricture and fairly narrow anterior urethra. We discussed management strategies moving forward, clearly Flomax did not work  for obvious reasons. I recommended we proceed with Optilume balloon dilation. I went through this with the patient including the risk and benefits. We discussed the risk of recurrence. The patient has opted to proceed after having all his questions answered. Will get this scheduled for him ASAP.

## 2022-05-25 NOTE — Op Note (Signed)
Preoperative diagnosis:  bulbar urethral stricture   Postoperative diagnosis:  Same   Procedure: Retrograde urethrogram with interpretation Cystourethroscopy Urethral stricture balloon dilation, OPTi lumen   Surgeon: Crist Fat, MD   Anesthesia: General   Complications: None   Intraoperative findings: The retrograde urethrogram was performed with 7F x 1.5cm bulbar urethral stricture noted.    EBL: Minimal   Specimens: None   Indication: Mark Reeves is a  57 y.o.  patient with history of urethral stricture.  After reviewing the management options for treatment, he elected to proceed with the above surgical procedure(s). We have discussed the potential benefits and risks of the procedure, side effects of the proposed treatment, the likelihood of the patient achieving the goals of the procedure, and any potential problems that might occur during the procedure or recuperation. Informed consent has been obtained.   Description of procedure:   The patient was taken to the operating room and general anesthesia was induced.  The patient was placed in the dorsal lithotomy position, prepped and draped in the usual sterile fashion, and preoperative antibiotics were administered. A preoperative time-out was performed.    A 14 French Foley catheter was inserted into the fossa navicularis and inflated with 3 cc of sterile water.  Then with the penis on stretch the C arm was rotated slightly and a retrograde urethrogram was performed with the above findings.  I then deflated the balloon and remove the catheter.  I subsequently passed the cystoscope through the patient's urethra and up to the stricture.  I then advanced a wire through the scope and across the stricture.  I confirmed its position on fluoroscopy.  I then removed the scope leaving the wire behind.  I then repassed the scope alongside the wire and passed the Optilume balloon dilator kit over the wire and across the stricture  under visual guidance.  I then inflated the balloon to 10 cc H2O and left it inflated for 5 minutes.  I then deflated the balloon and passed the cystoscope alongside the wire inspecting the area.  I did not pass the scope past this area.  Recognizing a widely patent urethra I remove the scope and passed a 14 French Foley catheter.  The urine was noted to efflux nice and clear.  The patient was subsequently awoken and returned to PACU stable condition.    Crist Fat, M.D.

## 2022-05-26 ENCOUNTER — Encounter (HOSPITAL_BASED_OUTPATIENT_CLINIC_OR_DEPARTMENT_OTHER): Payer: Self-pay | Admitting: Urology

## 2022-06-10 DIAGNOSIS — N401 Enlarged prostate with lower urinary tract symptoms: Secondary | ICD-10-CM | POA: Diagnosis not present

## 2022-06-10 DIAGNOSIS — R3912 Poor urinary stream: Secondary | ICD-10-CM | POA: Diagnosis not present

## 2022-06-10 DIAGNOSIS — R351 Nocturia: Secondary | ICD-10-CM | POA: Diagnosis not present

## 2022-06-29 DIAGNOSIS — N400 Enlarged prostate without lower urinary tract symptoms: Secondary | ICD-10-CM | POA: Diagnosis not present

## 2022-06-29 DIAGNOSIS — E1169 Type 2 diabetes mellitus with other specified complication: Secondary | ICD-10-CM | POA: Diagnosis not present

## 2022-06-29 DIAGNOSIS — I10 Essential (primary) hypertension: Secondary | ICD-10-CM | POA: Diagnosis not present

## 2022-06-29 DIAGNOSIS — E7800 Pure hypercholesterolemia, unspecified: Secondary | ICD-10-CM | POA: Diagnosis not present

## 2022-06-29 DIAGNOSIS — Z23 Encounter for immunization: Secondary | ICD-10-CM | POA: Diagnosis not present

## 2022-09-13 IMAGING — DX DG HAND COMPLETE 3+V*L*
3 series · 3 of 3 positions shown · non-contrast
Comparison: None.

CLINICAL DATA: Pain.  Swelling at second MCP joint.  Trauma.

EXAM:
LEFT HAND - COMPLETE 3+ VIEW

[hand pa]
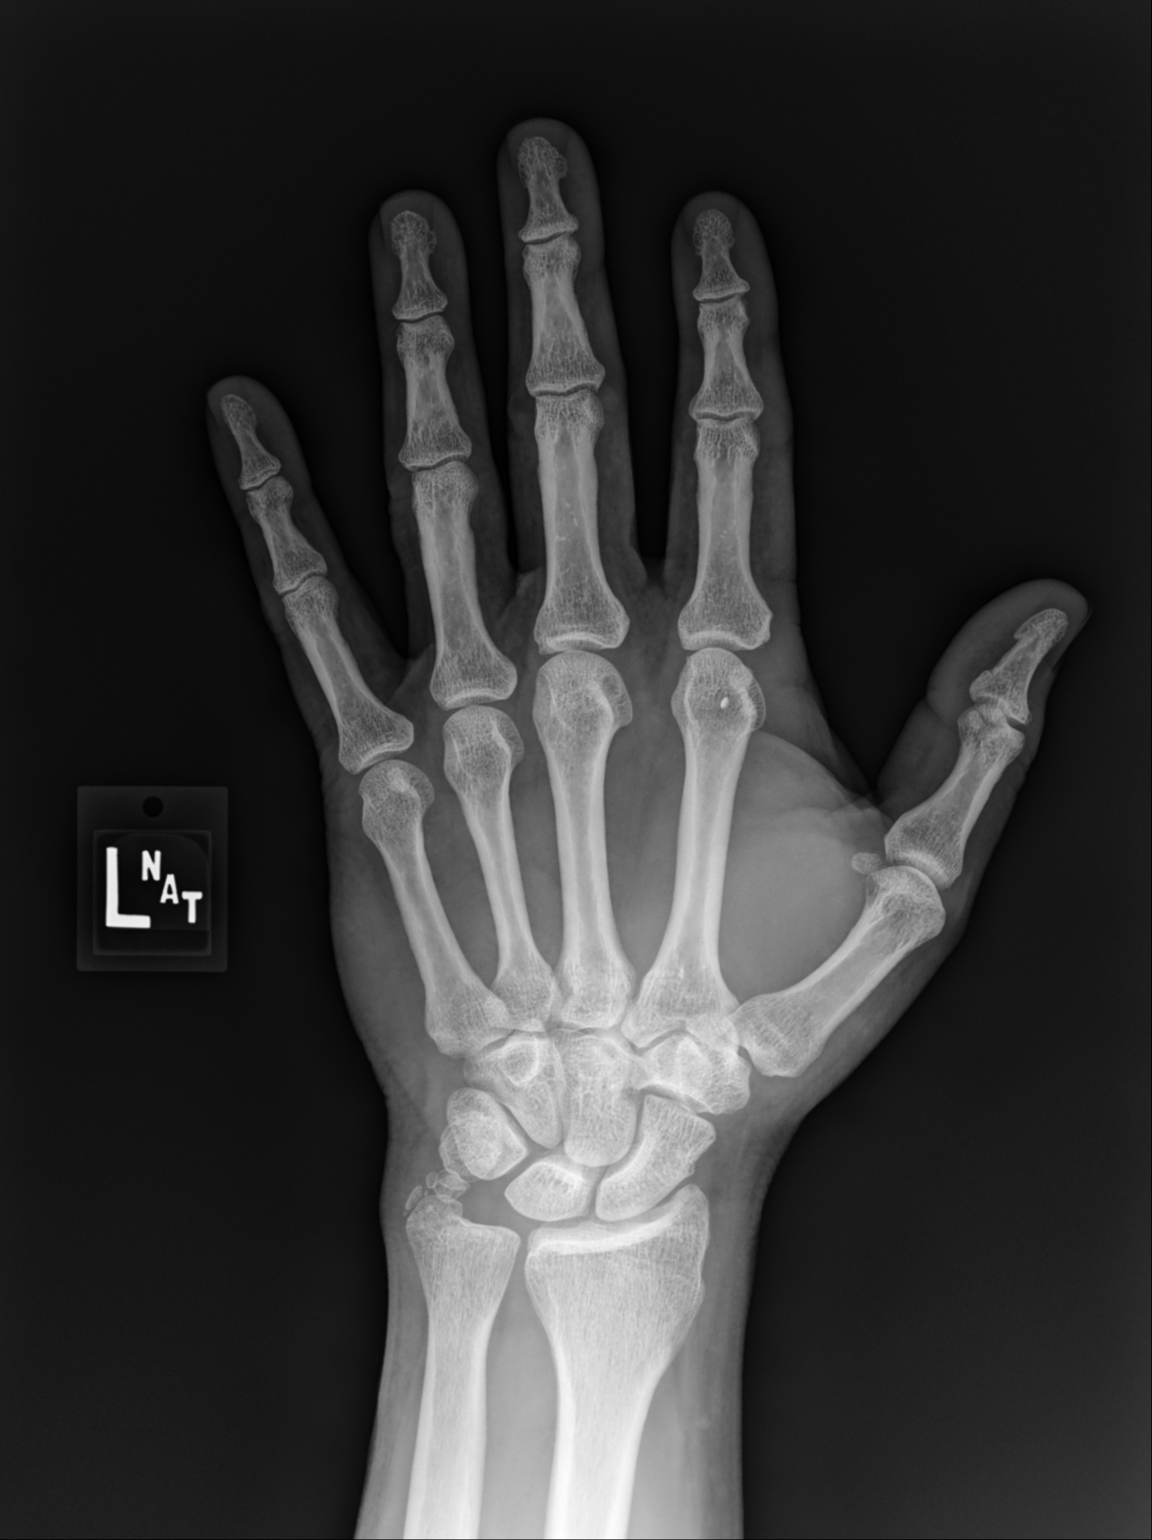

[hand mlo]
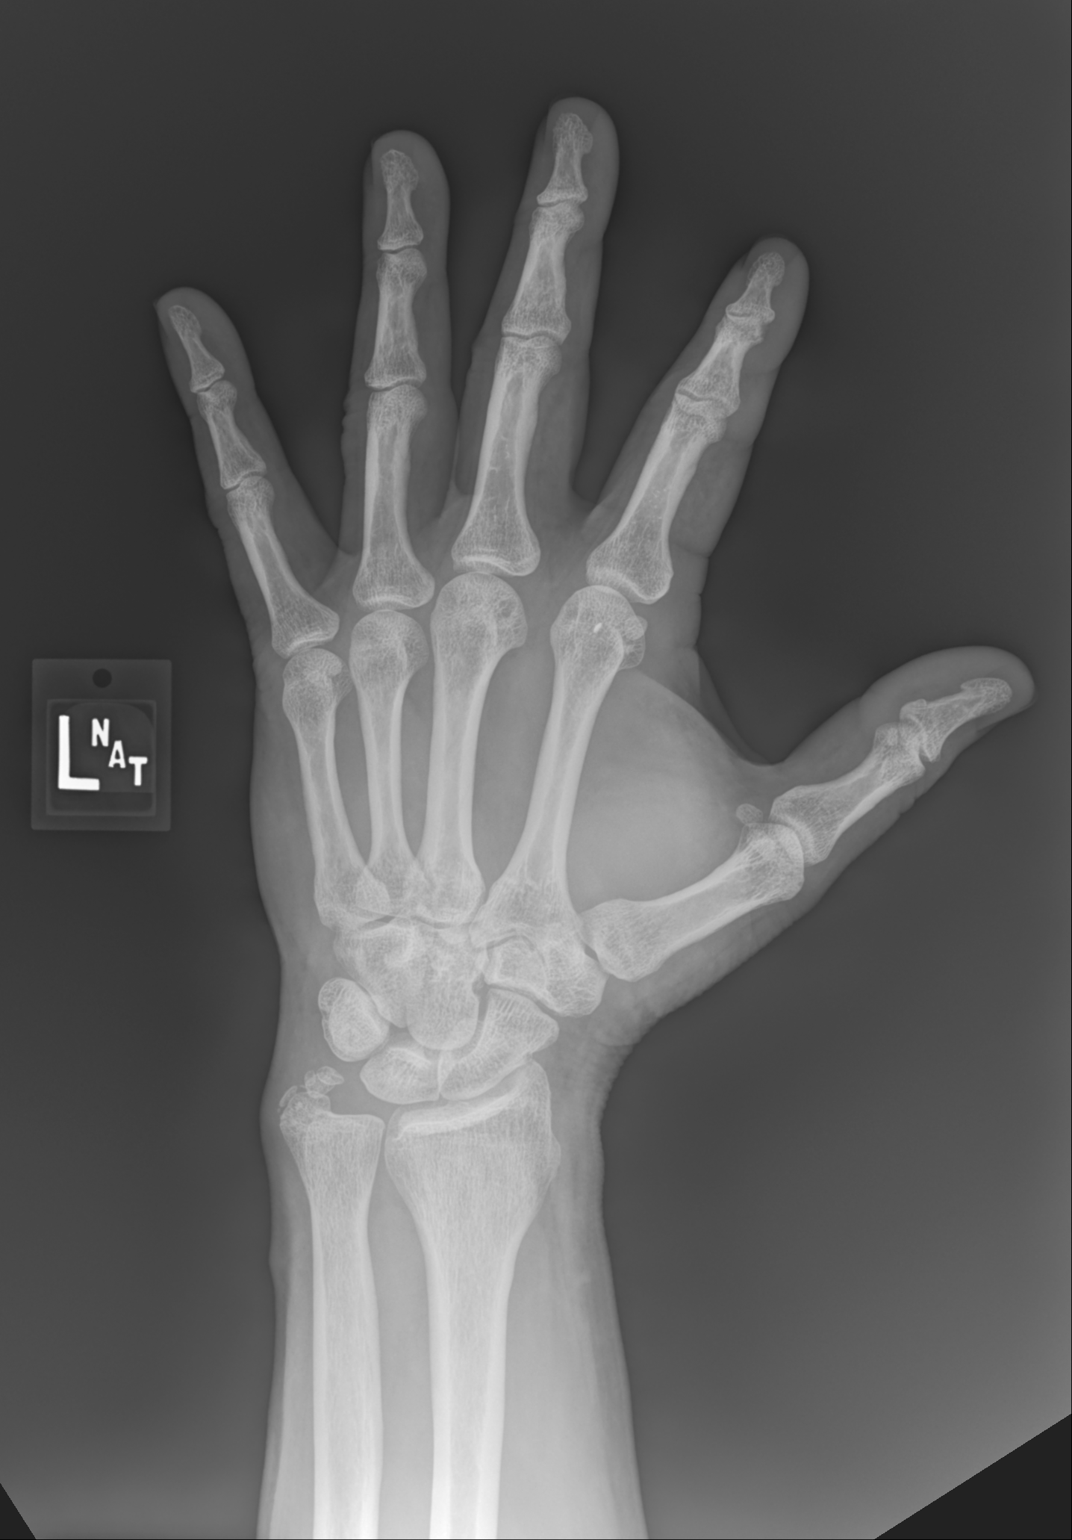

[hand lat]
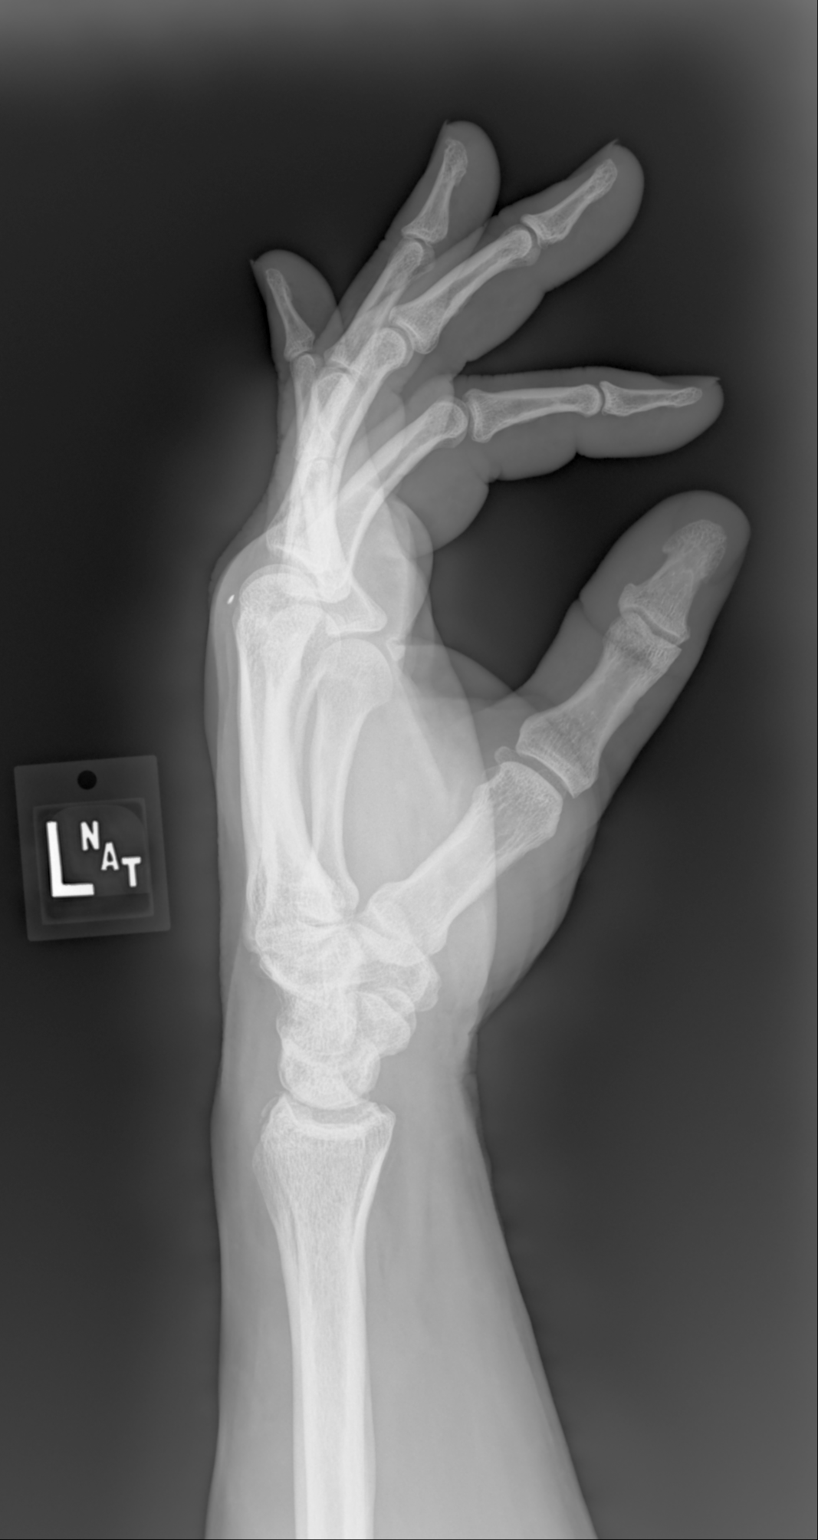

[3 of 3 positions shown; findings below may reference images not displayed]

FINDINGS: No fractures. A radiopaque focus is seen in the soft tissues
superficial to the distal second metatarsal located dorsally. No
other acute abnormalities.
IMPRESSION: 1. Small radiopaque foreign body in the soft tissues dorsal to the
distal second metatarsal head. No fractures.

## 2022-10-14 DIAGNOSIS — N35819 Other urethral stricture, male, unspecified site: Secondary | ICD-10-CM | POA: Diagnosis not present

## 2022-10-26 DIAGNOSIS — L814 Other melanin hyperpigmentation: Secondary | ICD-10-CM | POA: Diagnosis not present

## 2022-10-26 DIAGNOSIS — B078 Other viral warts: Secondary | ICD-10-CM | POA: Diagnosis not present

## 2022-10-26 DIAGNOSIS — L570 Actinic keratosis: Secondary | ICD-10-CM | POA: Diagnosis not present

## 2022-10-26 DIAGNOSIS — L821 Other seborrheic keratosis: Secondary | ICD-10-CM | POA: Diagnosis not present

## 2022-10-26 DIAGNOSIS — D2262 Melanocytic nevi of left upper limb, including shoulder: Secondary | ICD-10-CM | POA: Diagnosis not present

## 2022-10-26 DIAGNOSIS — D225 Melanocytic nevi of trunk: Secondary | ICD-10-CM | POA: Diagnosis not present

## 2022-11-01 DIAGNOSIS — R3912 Poor urinary stream: Secondary | ICD-10-CM | POA: Diagnosis not present

## 2022-11-01 DIAGNOSIS — N3943 Post-void dribbling: Secondary | ICD-10-CM | POA: Diagnosis not present

## 2022-11-28 DIAGNOSIS — R339 Retention of urine, unspecified: Secondary | ICD-10-CM | POA: Diagnosis not present

## 2022-12-06 DIAGNOSIS — N35819 Other urethral stricture, male, unspecified site: Secondary | ICD-10-CM | POA: Diagnosis not present

## 2022-12-06 DIAGNOSIS — R3912 Poor urinary stream: Secondary | ICD-10-CM | POA: Diagnosis not present

## 2023-01-20 DIAGNOSIS — J069 Acute upper respiratory infection, unspecified: Secondary | ICD-10-CM | POA: Diagnosis not present

## 2023-02-07 DIAGNOSIS — I10 Essential (primary) hypertension: Secondary | ICD-10-CM | POA: Diagnosis not present

## 2023-02-07 DIAGNOSIS — E1169 Type 2 diabetes mellitus with other specified complication: Secondary | ICD-10-CM | POA: Diagnosis not present

## 2023-02-07 DIAGNOSIS — E7800 Pure hypercholesterolemia, unspecified: Secondary | ICD-10-CM | POA: Diagnosis not present

## 2023-02-07 DIAGNOSIS — Z125 Encounter for screening for malignant neoplasm of prostate: Secondary | ICD-10-CM | POA: Diagnosis not present

## 2023-02-10 DIAGNOSIS — E7800 Pure hypercholesterolemia, unspecified: Secondary | ICD-10-CM | POA: Diagnosis not present

## 2023-02-10 DIAGNOSIS — I10 Essential (primary) hypertension: Secondary | ICD-10-CM | POA: Diagnosis not present

## 2023-02-10 DIAGNOSIS — Z0000 Encounter for general adult medical examination without abnormal findings: Secondary | ICD-10-CM | POA: Diagnosis not present

## 2023-02-10 DIAGNOSIS — E1169 Type 2 diabetes mellitus with other specified complication: Secondary | ICD-10-CM | POA: Diagnosis not present

## 2023-02-10 DIAGNOSIS — Z23 Encounter for immunization: Secondary | ICD-10-CM | POA: Diagnosis not present

## 2023-05-11 DIAGNOSIS — R339 Retention of urine, unspecified: Secondary | ICD-10-CM | POA: Diagnosis not present

## 2023-08-11 DIAGNOSIS — E7800 Pure hypercholesterolemia, unspecified: Secondary | ICD-10-CM | POA: Diagnosis not present

## 2023-08-11 DIAGNOSIS — I10 Essential (primary) hypertension: Secondary | ICD-10-CM | POA: Diagnosis not present

## 2023-08-11 DIAGNOSIS — E1169 Type 2 diabetes mellitus with other specified complication: Secondary | ICD-10-CM | POA: Diagnosis not present

## 2023-08-15 DIAGNOSIS — I10 Essential (primary) hypertension: Secondary | ICD-10-CM | POA: Diagnosis not present

## 2023-08-15 DIAGNOSIS — N529 Male erectile dysfunction, unspecified: Secondary | ICD-10-CM | POA: Diagnosis not present

## 2023-08-15 DIAGNOSIS — E1169 Type 2 diabetes mellitus with other specified complication: Secondary | ICD-10-CM | POA: Diagnosis not present

## 2023-08-15 DIAGNOSIS — E7800 Pure hypercholesterolemia, unspecified: Secondary | ICD-10-CM | POA: Diagnosis not present

## 2023-12-05 DIAGNOSIS — N35812 Other urethral bulbous stricture, male: Secondary | ICD-10-CM | POA: Diagnosis not present
